# Patient Record
Sex: Female | Born: 1970 | Race: Black or African American | Hispanic: No | Marital: Married | Smoking: Never smoker
Health system: Southern US, Community
[De-identification: ages and names within clinical notes are randomized; demographics above are authoritative.]

## PROBLEM LIST (undated history)

## (undated) DIAGNOSIS — B999 Unspecified infectious disease: Secondary | ICD-10-CM

## (undated) DIAGNOSIS — D649 Anemia, unspecified: Secondary | ICD-10-CM

## (undated) DIAGNOSIS — E559 Vitamin D deficiency, unspecified: Secondary | ICD-10-CM

## (undated) HISTORY — PX: DILATION AND CURETTAGE OF UTERUS: SHX78

---

## 2006-03-17 ENCOUNTER — Emergency Department (HOSPITAL_COMMUNITY): Admission: EM | Admit: 2006-03-17 | Discharge: 2006-03-17 | Payer: Self-pay | Admitting: Emergency Medicine

## 2006-06-02 ENCOUNTER — Emergency Department (HOSPITAL_COMMUNITY): Admission: EM | Admit: 2006-06-02 | Discharge: 2006-06-02 | Payer: Self-pay | Admitting: Emergency Medicine

## 2006-07-10 ENCOUNTER — Emergency Department (HOSPITAL_COMMUNITY): Admission: EM | Admit: 2006-07-10 | Discharge: 2006-07-10 | Payer: Self-pay | Admitting: Emergency Medicine

## 2016-01-19 ENCOUNTER — Encounter (HOSPITAL_COMMUNITY): Payer: Self-pay | Admitting: Emergency Medicine

## 2016-01-19 ENCOUNTER — Emergency Department (HOSPITAL_COMMUNITY)

## 2016-01-19 ENCOUNTER — Emergency Department (HOSPITAL_COMMUNITY)
Admission: EM | Admit: 2016-01-19 | Discharge: 2016-01-19 | Disposition: A | Discharge status: Home / Self Care | Attending: Emergency Medicine | Admitting: Emergency Medicine

## 2016-01-19 DIAGNOSIS — Z79899 Other long term (current) drug therapy: Secondary | ICD-10-CM | POA: Diagnosis not present

## 2016-01-19 DIAGNOSIS — O039 Complete or unspecified spontaneous abortion without complication: Secondary | ICD-10-CM

## 2016-01-19 DIAGNOSIS — O4690 Antepartum hemorrhage, unspecified, unspecified trimester: Secondary | ICD-10-CM | POA: Diagnosis present

## 2016-01-19 DIAGNOSIS — N939 Abnormal uterine and vaginal bleeding, unspecified: Secondary | ICD-10-CM

## 2016-01-19 HISTORY — DX: Vitamin D deficiency, unspecified: E55.9

## 2016-01-19 LAB — CBC WITH DIFFERENTIAL/PLATELET
Basophils Absolute: 0.1 10*3/uL (ref 0.0–0.1)
Basophils Relative: 1 %
EOS PCT: 3 %
Eosinophils Absolute: 0.2 10*3/uL (ref 0.0–0.7)
HCT: 34.8 % — ABNORMAL LOW (ref 36.0–46.0)
Hemoglobin: 11.5 g/dL — ABNORMAL LOW (ref 12.0–15.0)
LYMPHS ABS: 1.6 10*3/uL (ref 0.7–4.0)
LYMPHS PCT: 34 %
MCH: 30.7 pg (ref 26.0–34.0)
MCHC: 33 g/dL (ref 30.0–36.0)
MCV: 93 fL (ref 78.0–100.0)
MONOS PCT: 8 %
Monocytes Absolute: 0.4 10*3/uL (ref 0.1–1.0)
Neutro Abs: 2.5 10*3/uL (ref 1.7–7.7)
Neutrophils Relative %: 54 %
PLATELETS: 235 10*3/uL (ref 150–400)
RBC: 3.74 MIL/uL — AB (ref 3.87–5.11)
RDW: 12.9 % (ref 11.5–15.5)
WBC: 4.6 10*3/uL (ref 4.0–10.5)

## 2016-01-19 LAB — WET PREP, GENITAL
CLUE CELLS WET PREP: NONE SEEN
SPERM: NONE SEEN
TRICH WET PREP: NONE SEEN
Yeast Wet Prep HPF POC: NONE SEEN

## 2016-01-19 LAB — ABO/RH: ABO/RH(D): A POS

## 2016-01-19 LAB — I-STAT BETA HCG BLOOD, ED (MC, WL, AP ONLY): I-stat hCG, quantitative: 8 m[IU]/mL — ABNORMAL HIGH (ref <5)

## 2016-01-19 NOTE — Discharge Instructions (Signed)
Miscarriage  A miscarriage is the sudden loss of an unborn baby (fetus) before the 20th week of pregnancy. Most miscarriages happen in the first 3 months of pregnancy. Sometimes, it happens before a woman even knows she is pregnant. A miscarriage is also called a "spontaneous miscarriage" or "early pregnancy loss." Having a miscarriage can be an emotional experience. Talk with your caregiver about any questions you may have about miscarrying, the grieving process, and your future pregnancy plans.  CAUSES    Problems with the fetal chromosomes that make it impossible for the baby to develop normally. Problems with the baby's genes or chromosomes are most often the result of errors that occur, by chance, as the embryo divides and grows. The problems are not inherited from the parents.   Infection of the cervix or uterus.    Hormone problems.    Problems with the cervix, such as having an incompetent cervix. This is when the tissue in the cervix is not strong enough to hold the pregnancy.    Problems with the uterus, such as an abnormally shaped uterus, uterine fibroids, or congenital abnormalities.    Certain medical conditions.    Smoking, drinking alcohol, or taking illegal drugs.    Trauma.   Often, the cause of a miscarriage is unknown.   SYMPTOMS    Vaginal bleeding or spotting, with or without cramps or pain.   Pain or cramping in the abdomen or lower back.   Passing fluid, tissue, or blood clots from the vagina.  DIAGNOSIS   Your caregiver will perform a physical exam. You may also have an ultrasound to confirm the miscarriage. Blood or urine tests may also be ordered.  TREATMENT    Sometimes, treatment is not necessary if you naturally pass all the fetal tissue that was in the uterus. If some of the fetus or placenta remains in the body (incomplete miscarriage), tissue left behind may become infected and must be removed. Usually, a dilation and curettage (D and C) procedure is performed.  During a D and C procedure, the cervix is widened (dilated) and any remaining fetal or placental tissue is gently removed from the uterus.   Antibiotic medicines are prescribed if there is an infection. Other medicines may be given to reduce the size of the uterus (contract) if there is a lot of bleeding.   If you have Rh negative blood and your baby was Rh positive, you will need a Rh immunoglobulin shot. This shot will protect any future baby from having Rh blood problems in future pregnancies.  HOME CARE INSTRUCTIONS    Your caregiver may order bed rest or may allow you to continue light activity. Resume activity as directed by your caregiver.   Have someone help with home and family responsibilities during this time.    Keep track of the number of sanitary pads you use each day and how soaked (saturated) they are. Write down this information.    Do not use tampons. Do not douche or have sexual intercourse until approved by your caregiver.    Only take over-the-counter or prescription medicines for pain or discomfort as directed by your caregiver.    Do not take aspirin. Aspirin can cause bleeding.    Keep all follow-up appointments with your caregiver.    If you or your partner have problems with grieving, talk to your caregiver or seek counseling to help cope with the pregnancy loss. Allow enough time to grieve before trying to get pregnant again.     SEEK IMMEDIATE MEDICAL CARE IF:    You have severe cramps or pain in your back or abdomen.   You have a fever.   You pass large blood clots (walnut-sized or larger) ortissue from your vagina. Save any tissue for your caregiver to inspect.    Your bleeding increases.    You have a thick, bad-smelling vaginal discharge.   You become lightheaded, weak, or you faint.    You have chills.   MAKE SURE YOU:   Understand these instructions.   Will watch your condition.   Will get help right away if you are not doing well or get worse.     This  information is not intended to replace advice given to you by your health care provider. Make sure you discuss any questions you have with your health care provider.     Document Released: 01/19/2001 Document Revised: 11/20/2012 Document Reviewed: 09/14/2011  Elsevier Interactive Patient Education 2016 [employer]

## 2016-01-19 NOTE — ED Notes (Signed)
Pt c/o vag bleeding -- spotting started yesterday with light pink color, got heavier as day went on-- using overnight pads-- changed x 3 todfay-- states "not saturated" positive pregnancy test x 2 on Monday.

## 2016-01-19 NOTE — ED Provider Notes (Signed)
CSN: [id number]     Arrival date & time 01/19/16  1036 History  By signing my name below, I, Kelli Luna, attest that this documentation has been prepared under the direction and in the presence of Kelli Reichert, MD. Electronically Signed: Eustaquio Luna, ED Scribe. 01/19/2016. 5:42 PM.   Chief Complaint  Patient presents with  . Vaginal Bleeding  . Routine Prenatal Visit   The history is provided by the patient. No language interpreter was used.    HPI Comments: Kelli Luna is a 45 y.o. female who presents to the Emergency Department complaining of gradual onset, constant, vaginal bleeding that began yesterday. Pt reports that it started out as light spotting and has progressed to heavy bleeding. She also complains of intermittent mild abdominal cramping. Pt took 2 at home pregnancy tests 1 week ago which were positive. She noticed that she passed tissue like substance while using the restroom earlier today. [id number]. Pt states that the first time she had a miscarriage (1994) she was told that her body was still getting used to being pregnant. The second time she was pregnant (again in 1994) she carried the child to 6 months. The physicians could not find anything wrong with pt at that time but did not due an autopsy of the child. Pt had a D&C for the first pregnancy and had to induce labor. No hx bleeding disorders. LNMP: 12/15/2015. Denies any other associated symptoms.   Past Medical History  Diagnosis Date  . Vitamin D deficiency    Past Surgical History  Procedure Laterality Date  . Dilation and curettage of uterus     No family history on file. Social History  Substance Use Topics  . Smoking status: Never Smoker   . Smokeless tobacco: None  . Alcohol Use: No   OB History    Gravida Para Term Preterm AB TAB SAB Ectopic Multiple Living   3 0 0  2  2        Review of Systems  Gastrointestinal: Positive for abdominal pain.  Genitourinary: Positive for vaginal bleeding.  All  other systems reviewed and are negative.  Allergies  Review of patient's allergies indicates not on file.  Home Medications   Prior to Admission medications   Medication Sig Start Date End Date Taking? Authorizing Provider  fexofenadine (ALLEGRA) 180 MG tablet Take 180 mg by mouth daily as needed for allergies or rhinitis.   Yes Historical Provider, MD  Ibuprofen-Diphenhydramine Cit (ADVIL PM) 200-38 MG TABS Take 1-2 tablets by mouth at bedtime as needed (FOR PAIN AND SLEEP).   Yes Historical Provider, MD  Prenatal Vit-Fe Fumarate-FA (MULTIVITAMIN-PRENATAL) 27-0.8 MG TABS tablet Take 1 tablet by mouth daily at 12 noon.   Yes Historical Provider, MD   BP 119/90 mmHg  Pulse 69  Temp(Src) 98.1 F (36.7 C) (Oral)  Resp 16  SpO2 100%   Physical Exam  Constitutional: She is oriented to person, place, and time. She appears well-developed and well-nourished.  HENT:  Head: Normocephalic and atraumatic.  Cardiovascular: Normal rate and regular rhythm.   No murmur heard. Pulmonary/Chest: Effort normal and breath sounds normal. No respiratory distress.  Abdominal: Soft. There is no tenderness. There is no rebound and no guarding.  Genitourinary:  Female chaperon present.  Os was finger tip open. Moderate vaginal bleeding with clots evacuated from vaginal vault with no recurrent pooling. No CMT.  Musculoskeletal: She exhibits no edema or tenderness.  Neurological: She is alert and oriented to person, place, and time.  Skin: Skin is warm and dry.  Psychiatric: She has a normal mood and affect. Her behavior is normal.  Nursing note and vitals reviewed.   ED Course  Procedures (including critical care time)  DIAGNOSTIC STUDIES: Oxygen Saturation is 100% on RA, normal by my interpretation.    COORDINATION OF CARE: 5:42 PM-Discussed treatment plan which includes US Pelvis and pelvic exam with pt at bedside and pt agreed to plan.   Labs Review Labs Reviewed  WET PREP, GENITAL -  Abnormal; Notable for the following:    WBC, Wet Prep HPF POC PRESENT (*)    All other components within normal limits  CBC WITH DIFFERENTIAL/PLATELET - Abnormal; Notable for the following:    RBC 3.74 (*)    Hemoglobin 11.5 (*)    HCT 34.8 (*)    All other components within normal limits  I-STAT BETA HCG BLOOD, ED ([facility], [facility], [facility] ONLY) - Abnormal; Notable for the following:    I-stat hCG, quantitative 8.0 (*)    All other components within normal limits  ABO/RH  GC/CHLAMYDIA PROBE AMP ([place]) NOT AT [facility]    Imaging Review US Ob Comp Less 14 Wks  01/19/2016  CLINICAL DATA:  Acute onset of vaginal bleeding.  Initial encounter. EXAM: OBSTETRIC <14 WK [place] AND TRANSVAGINAL OB US TECHNIQUE: Both transabdominal and transvaginal ultrasound examinations were performed for complete evaluation of the gestation as well as the maternal uterus, adnexal regions, and pelvic cul-de-sac. Transvaginal technique was performed to assess early pregnancy. COMPARISON:  CT of the abdomen and pelvis from 07/10/2006 FINDINGS: Intrauterine gestational sac: None seen. Yolk sac:  N/A Embryo:  N/A Subchorionic hemorrhage:  None visualized. Maternal uterus/adnexae: A 4.0 cm fibroid is noted at the left side of the uterus, and a 3.0 cm fibroid is noted at the anterior superior aspect of the uterus. This partially obscures the endometrial echo complex near the uterine fundus. The ovaries are unremarkable in appearance. The right ovary measures 4.0 x 3.4 x 2.1 cm, while the left ovary measures 2.6 x 2.1 x 1.4 cm. No suspicious adnexal masses are seen; there is no evidence for ovarian torsion. No free fluid is seen within the pelvic cul-de-sac. IMPRESSION: 1. Uterine fibroids measure up to 4.0 cm in size. This partially obscures the endometrial echo complex near the uterine fundus. Uterus otherwise unremarkable. 2. Ovaries unremarkable in appearance. No evidence for ovarian torsion. Electronically Signed   By: Garald Balding M.D.    On: 01/19/2016 20:23   US Ob Transvaginal  01/19/2016  CLINICAL DATA:  Acute onset of vaginal bleeding.  Initial encounter. EXAM: OBSTETRIC <14 WK [place] AND TRANSVAGINAL OB US TECHNIQUE: Both transabdominal and transvaginal ultrasound examinations were performed for complete evaluation of the gestation as well as the maternal uterus, adnexal regions, and pelvic cul-de-sac. Transvaginal technique was performed to assess early pregnancy. COMPARISON:  CT of the abdomen and pelvis from 07/10/2006 FINDINGS: Intrauterine gestational sac: None seen. Yolk sac:  N/A Embryo:  N/A Subchorionic hemorrhage:  None visualized. Maternal uterus/adnexae: A 4.0 cm fibroid is noted at the left side of the uterus, and a 3.0 cm fibroid is noted at the anterior superior aspect of the uterus. This partially obscures the endometrial echo complex near the uterine fundus. The ovaries are unremarkable in appearance. The right ovary measures 4.0 x 3.4 x 2.1 cm, while the left ovary measures 2.6 x 2.1 x 1.4 cm. No suspicious adnexal masses are seen; there is no evidence for ovarian torsion. No free  fluid is seen within the pelvic cul-de-sac. IMPRESSION: 1. Uterine fibroids measure up to 4.0 cm in size. This partially obscures the endometrial echo complex near the uterine fundus. Uterus otherwise unremarkable. 2. Ovaries unremarkable in appearance. No evidence for ovarian torsion. Electronically Signed   By: Garald Balding M.D.   On: 01/19/2016 20:23   I have personally reviewed and evaluated these images and lab results as part of my medical decision-making.   EKG Interpretation None      MDM   Final diagnoses:  Vaginal bleeding  Miscarriage   Patient here for evaluation of vaginal bleeding. She had 2 positive pregnancy test earlier in this week at home. Beta hCG is minimally elevated at less than 8. Examination demonstrates small moderate amount of vaginal bleeding with clots present that were evacuated. There were no clots in  her Liane Comber her os is fingertip open. No evidence of major hemorrhage. Ultrasound with fibroids but otherwise unremarkable. Discussed with patient likely miscarriage. Discussed home care and outpatient follow-up as well as return precautions for bleeding. Discussed the case with on-call physician with [facility], plan to send for repeat quantitative hCG in one week  I personally performed the services described in this documentation, which was scribed in my presence. The recorded information has been reviewed and is accurate.      Kelli Reichert, MD 01/20/16 0104

## 2016-01-19 NOTE — ED Notes (Signed)
Assisted Ralene Bathe, MD with pelvic examination and collection of vaginal samples; visitor stayed at bedside per patient

## 2016-01-20 LAB — GC/CHLAMYDIA PROBE AMP (~~LOC~~) NOT AT ARMC
Chlamydia: NEGATIVE
NEISSERIA GONORRHEA: NEGATIVE

## 2016-01-27 ENCOUNTER — Encounter (HOSPITAL_COMMUNITY): Payer: Self-pay | Admitting: *Deleted

## 2016-01-27 ENCOUNTER — Inpatient Hospital Stay (HOSPITAL_COMMUNITY)
Admission: AD | Admit: 2016-01-27 | Discharge: 2016-01-27 | Disposition: A | Discharge status: Home / Self Care | Source: Ambulatory Visit | Attending: Family Medicine | Admitting: Family Medicine

## 2016-01-27 DIAGNOSIS — Z3A01 Less than 8 weeks gestation of pregnancy: Secondary | ICD-10-CM | POA: Diagnosis not present

## 2016-01-27 DIAGNOSIS — Z09 Encounter for follow-up examination after completed treatment for conditions other than malignant neoplasm: Secondary | ICD-10-CM | POA: Diagnosis not present

## 2016-01-27 DIAGNOSIS — O99281 Endocrine, nutritional and metabolic diseases complicating pregnancy, first trimester: Secondary | ICD-10-CM | POA: Insufficient documentation

## 2016-01-27 DIAGNOSIS — O039 Complete or unspecified spontaneous abortion without complication: Secondary | ICD-10-CM

## 2016-01-27 DIAGNOSIS — E559 Vitamin D deficiency, unspecified: Secondary | ICD-10-CM | POA: Diagnosis not present

## 2016-01-27 HISTORY — DX: Unspecified infectious disease: B99.9

## 2016-01-27 HISTORY — DX: Anemia, unspecified: D64.9

## 2016-01-27 LAB — HCG, QUANTITATIVE, PREGNANCY: HCG, BETA CHAIN, QUANT, S: 1 m[IU]/mL (ref <5)

## 2016-01-27 MED ORDER — PRENATAL 28-0.8 MG PO TABS: 1.0000 | ORAL_TABLET | Freq: Every day | ORAL | Status: DC

## 2016-01-27 NOTE — MAU Note (Addendum)
Feel really good today.  No spotting or anything since Friday, Friday morning had bad cramps  And some bleeding, passed a clot.  HX: had 2 + preg tests, had made an appointment to see primary (6/12) started bleeding 6/11, got heavier and started passing small clots, was having some cramping. Went to primary, was sent on to ED.  Went to [facility], was told to follow up here in a weekend.

## 2016-01-27 NOTE — MAU Provider Note (Signed)
History   [id number]   Chief Complaint  Patient presents with  . Follow-up    HPI Kelli Luna is a 45 y.o. female G3P0110 here for follow-up BHCG.  Upon review of the records patient was first seen on 6/12 at [facility] for vaginal bleeding.   BHCG on that day was 8. Pt reported 2 +HPTs the week prior. Ultrasound showed no IUP or adnexal mass. Pt discharged home to f/u at [facility] in 1 week for BHCG. Pt here today with no report of abdominal pain or vaginal bleeding.   All other systems negative.     Patient's last menstrual period was 12/15/2015.  OB History  Gravida Para Term Preterm AB SAB TAB Ectopic Multiple Living  3 1 0 1 1 1         # Outcome Date GA Lbr Len/2nd Weight Sex Delivery Anes PTL Lv  3 Current           2 SAB 1994          1 Preterm     M    FD    Obstetric Comments  Was 5-6 months    Past Medical History  Diagnosis Date  . Vitamin D deficiency   . Anemia   . Infection     UTI    Family History  Problem Relation Age of Onset  . Lupus Mother     Social History   Social History  . Marital Status: Married    Spouse Name: N/A  . Number of Children: N/A  . Years of Education: N/A   Social History Main Topics  . Smoking status: Never Smoker   . Smokeless tobacco: Never Used  . Alcohol Use: No  . Drug Use: No  . Sexual Activity: Not Asked   Other Topics Concern  . None   Social History Narrative    Not on File  No current facility-administered medications on file prior to encounter.   Current Outpatient Prescriptions on File Prior to Encounter  Medication Sig Dispense Refill  . fexofenadine (ALLEGRA) 180 MG tablet Take 180 mg by mouth daily as needed for allergies or rhinitis.    . Ibuprofen-Diphenhydramine Cit (ADVIL PM) 200-38 MG TABS Take 1-2 tablets by mouth at bedtime as needed (FOR PAIN AND SLEEP).    . Prenatal Vit-Fe Fumarate-FA (MULTIVITAMIN-PRENATAL) 27-0.8 MG TABS tablet Take 1 tablet by mouth daily at 12 noon.       Physical  Exam   Filed Vitals:   01/27/16 1235 01/27/16 1244  BP:  113/74  Pulse:  81  Temp:  98.4 F (36.9 C)  TempSrc:  Oral  Resp:  16  Weight: 198 lb 1.3 oz (89.848 kg)     Physical Exam  Constitutional: She appears well-developed and well-nourished. No distress.  HENT:  Head: Normocephalic and atraumatic.  Cardiovascular: Normal rate.   Respiratory: Effort normal. No respiratory distress.  Musculoskeletal: Normal range of motion.  Skin: She is not diaphoretic.  Psychiatric: She has a normal mood and affect. Her behavior is normal. Judgment and thought content normal.    MAU Course  Procedures Results for orders placed or performed during the hospital encounter of 01/27/16 (from the past 24 hour(s))  hCG, quantitative, pregnancy     Status: None   Collection Time: 01/27/16 12:35 PM  Result Value Ref Range   hCG, Beta Chain, Quant, S 1 <5 mIU/mL    MDM BHCG negative today Denies abdominal pain or vaginal bleeding.   Assessment  and Plan  45 y.o. G3P0110 at [redacted]w[redacted]d wks Pregnancy Follow-up BHCG Chemical pregnancy  P: Discharge home Discussed reasons to return to MAU Pt interested in going to [facility] for ob/gyn care -- information provided    Jorje Guild, NP 01/27/2016 2:03 PM

## 2016-01-27 NOTE — Discharge Instructions (Signed)
Miscarriage  A miscarriage is the sudden loss of an unborn baby (fetus) before the 20th week of pregnancy. Most miscarriages happen in the first 3 months of pregnancy. Sometimes, it happens before a woman even knows she is pregnant. A miscarriage is also called a "spontaneous miscarriage" or "early pregnancy loss." Having a miscarriage can be an emotional experience. Talk with your caregiver about any questions you may have about miscarrying, the grieving process, and your future pregnancy plans.  CAUSES    Problems with the fetal chromosomes that make it impossible for the baby to develop normally. Problems with the baby's genes or chromosomes are most often the result of errors that occur, by chance, as the embryo divides and grows. The problems are not inherited from the parents.   Infection of the cervix or uterus.    Hormone problems.    Problems with the cervix, such as having an incompetent cervix. This is when the tissue in the cervix is not strong enough to hold the pregnancy.    Problems with the uterus, such as an abnormally shaped uterus, uterine fibroids, or congenital abnormalities.    Certain medical conditions.    Smoking, drinking alcohol, or taking illegal drugs.    Trauma.   Often, the cause of a miscarriage is unknown.   SYMPTOMS    Vaginal bleeding or spotting, with or without cramps or pain.   Pain or cramping in the abdomen or lower back.   Passing fluid, tissue, or blood clots from the vagina.  DIAGNOSIS   Your caregiver will perform a physical exam. You may also have an ultrasound to confirm the miscarriage. Blood or urine tests may also be ordered.  TREATMENT    Sometimes, treatment is not necessary if you naturally pass all the fetal tissue that was in the uterus. If some of the fetus or placenta remains in the body (incomplete miscarriage), tissue left behind may become infected and must be removed. Usually, a dilation and curettage (D and C) procedure is performed.  During a D and C procedure, the cervix is widened (dilated) and any remaining fetal or placental tissue is gently removed from the uterus.   Antibiotic medicines are prescribed if there is an infection. Other medicines may be given to reduce the size of the uterus (contract) if there is a lot of bleeding.   If you have Rh negative blood and your baby was Rh positive, you will need a Rh immunoglobulin shot. This shot will protect any future baby from having Rh blood problems in future pregnancies.  HOME CARE INSTRUCTIONS    Your caregiver may order bed rest or may allow you to continue light activity. Resume activity as directed by your caregiver.   Have someone help with home and family responsibilities during this time.    Keep track of the number of sanitary pads you use each day and how soaked (saturated) they are. Write down this information.    Do not use tampons. Do not douche or have sexual intercourse until approved by your caregiver.    Only take over-the-counter or prescription medicines for pain or discomfort as directed by your caregiver.    Do not take aspirin. Aspirin can cause bleeding.    Keep all follow-up appointments with your caregiver.    If you or your partner have problems with grieving, talk to your caregiver or seek counseling to help cope with the pregnancy loss. Allow enough time to grieve before trying to get pregnant again.     SEEK IMMEDIATE MEDICAL CARE IF:    You have severe cramps or pain in your back or abdomen.   You have a fever.   You pass large blood clots (walnut-sized or larger) ortissue from your vagina. Save any tissue for your caregiver to inspect.    Your bleeding increases.    You have a thick, bad-smelling vaginal discharge.   You become lightheaded, weak, or you faint.    You have chills.   MAKE SURE YOU:   Understand these instructions.   Will watch your condition.   Will get help right away if you are not doing well or get worse.     This  information is not intended to replace advice given to you by your health care provider. Make sure you discuss any questions you have with your health care provider.     Document Released: 01/19/2001 Document Revised: 11/20/2012 Document Reviewed: 09/14/2011  Elsevier Interactive Patient Education 2016 [employer]

## 2016-02-23 ENCOUNTER — Telehealth: Payer: Self-pay

## 2016-02-23 NOTE — Telephone Encounter (Signed)
Called pt per request of Dr. Ihor Dow to find out if pt needs referral to our office for prenatal care.  LM for pt to return call. Re:  Referral request located in provider folder.

## 2016-02-25 ENCOUNTER — Other Ambulatory Visit

## 2016-02-26 NOTE — Telephone Encounter (Signed)
Per Chart patient has appt schedule for BHCG on 03/01/2016

## 2016-03-01 ENCOUNTER — Other Ambulatory Visit

## 2016-03-01 DIAGNOSIS — O039 Complete or unspecified spontaneous abortion without complication: Secondary | ICD-10-CM

## 2016-03-01 LAB — HCG, QUANTITATIVE, PREGNANCY: hCG, Beta Chain, Quant, S: <2 m[IU]/mL

## 2016-03-15 NOTE — Telephone Encounter (Signed)
03/01/16 beta level resulted < 2.  Pt does not need appt for prenatal care.

## 2016-07-19 IMAGING — US US OB COMP LESS 14 WK
1 series · 13 of 28 positions shown · non-contrast
Comparison: CT of the abdomen and pelvis from 07/10/2006

CLINICAL DATA: Acute onset of vaginal bleeding.  Initial encounter.

EXAM:
OBSTETRIC <14 WK US AND TRANSVAGINAL OB US
TECHNIQUE: Both transabdominal and transvaginal ultrasound examinations were
performed for complete evaluation of the gestation as well as the
maternal uterus, adnexal regions, and pelvic cul-de-sac.
Transvaginal technique was performed to assess early pregnancy.

[Series 1: us ob comp less 14 wk · 0.26mm/px · 65 acquisitions, 13 frames shown]
[im 3/65]
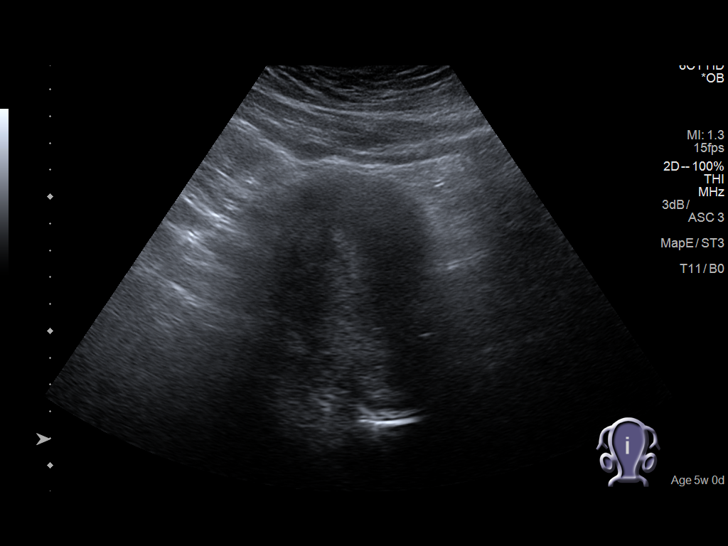
[im 8/65]
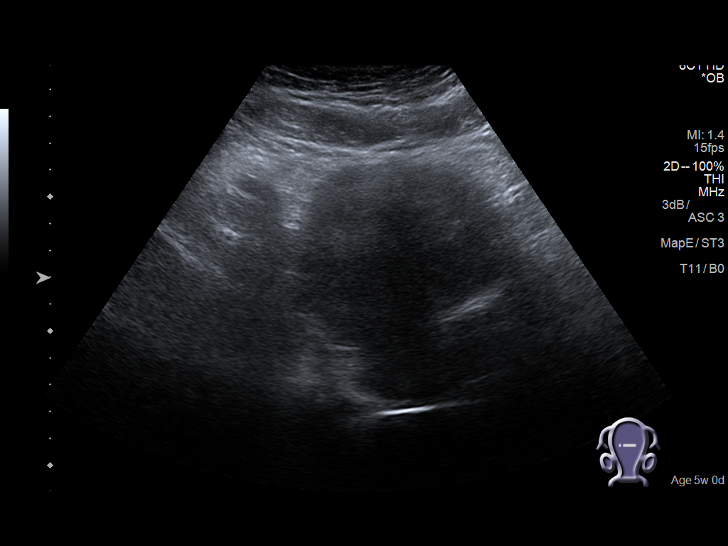
[im 12/65]
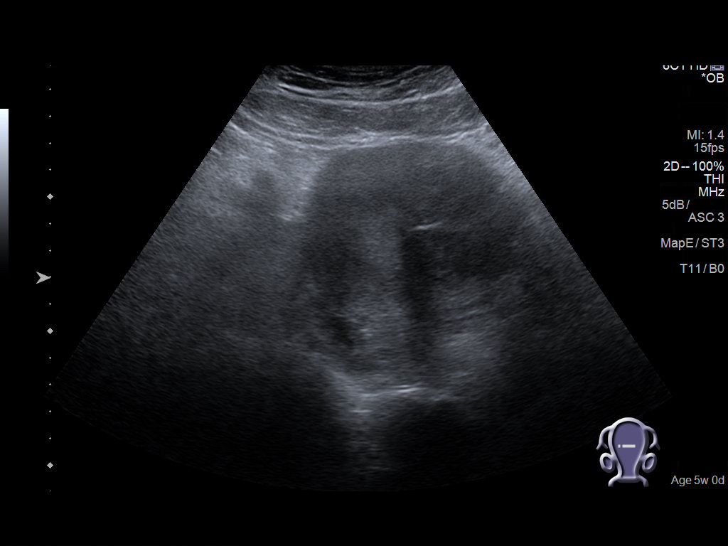
[im 17/65]
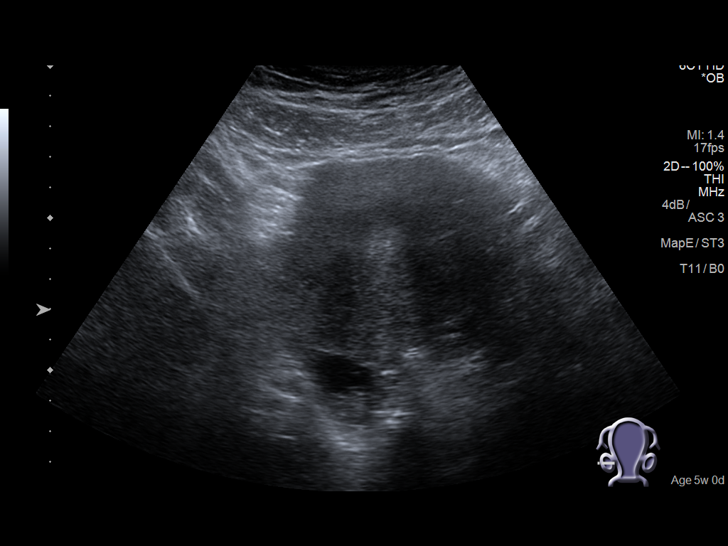
[im 22/65]
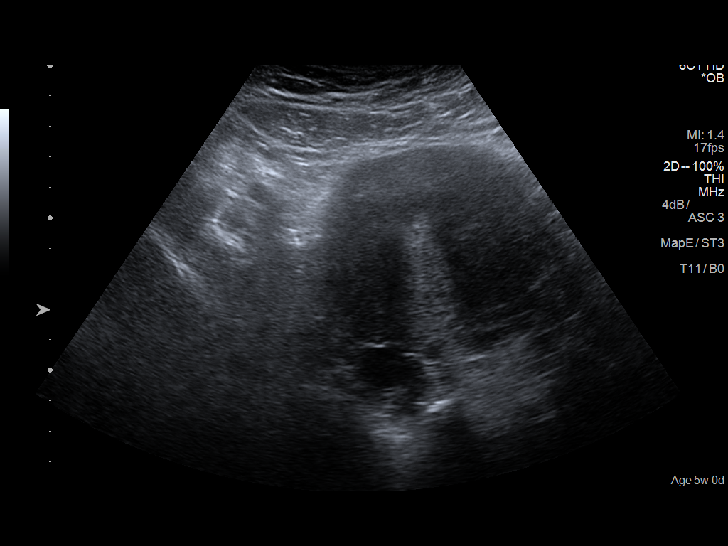
[im 27/65]
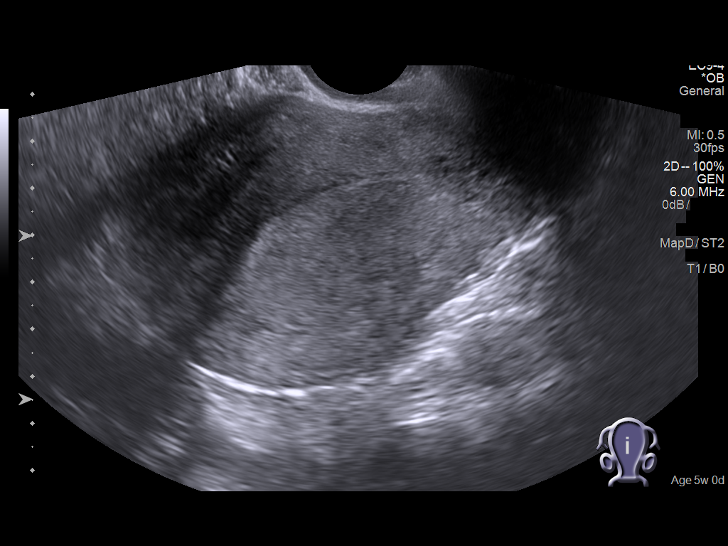
[im 34/65]
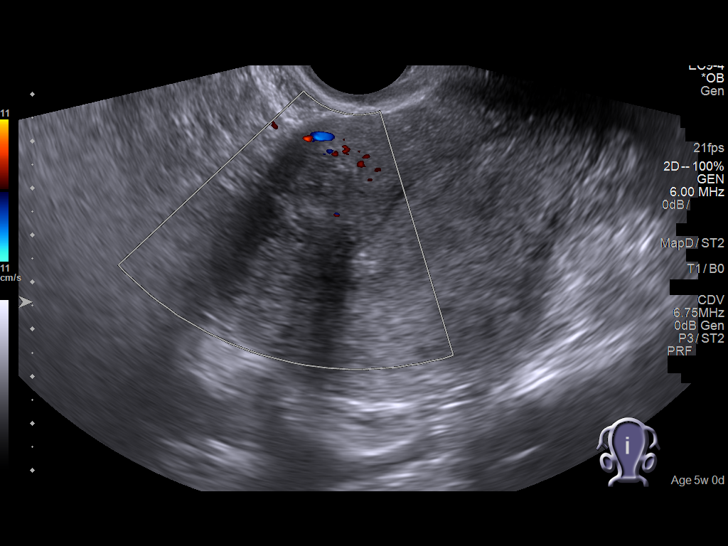
[im 38/65]
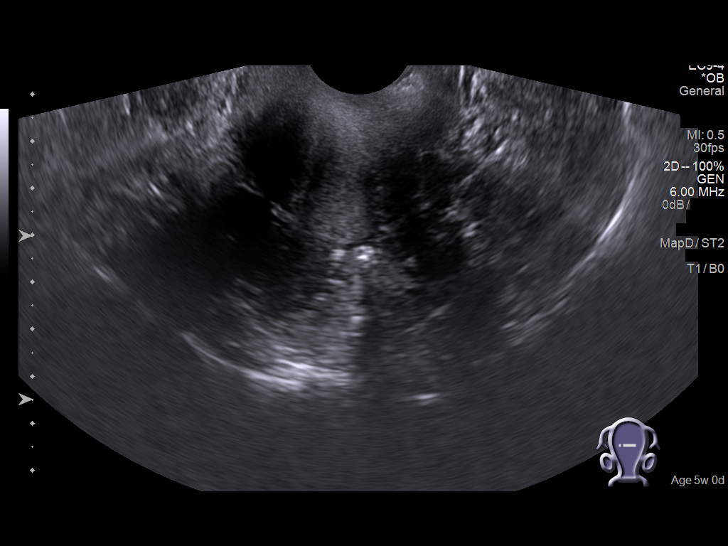
[im 43/65]
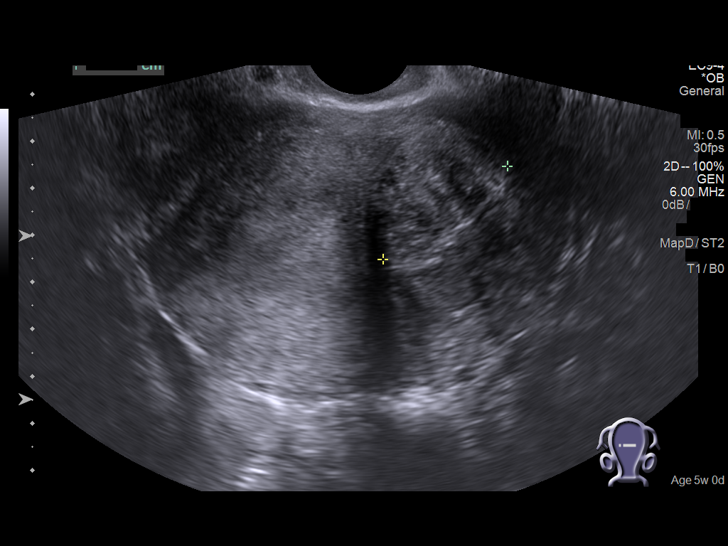
[im 48/65]
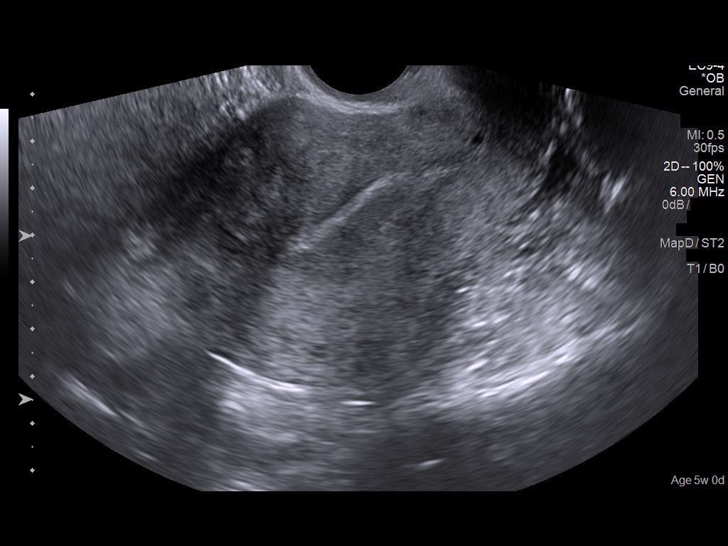
[im 53/65]
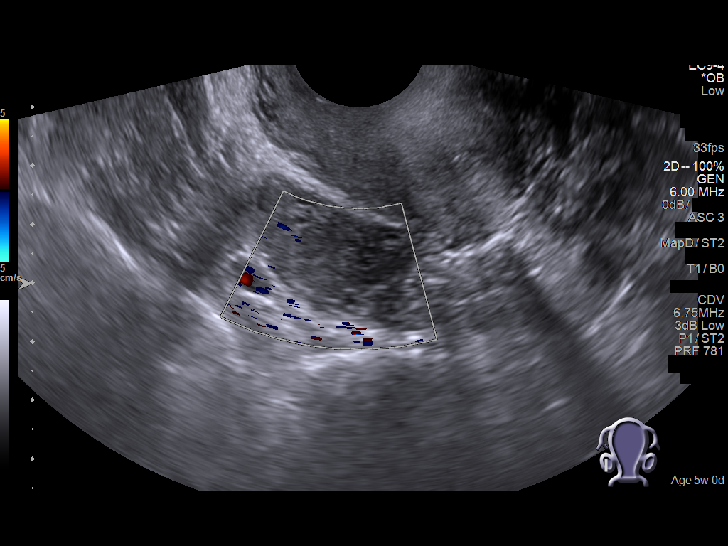
[im 57/65]
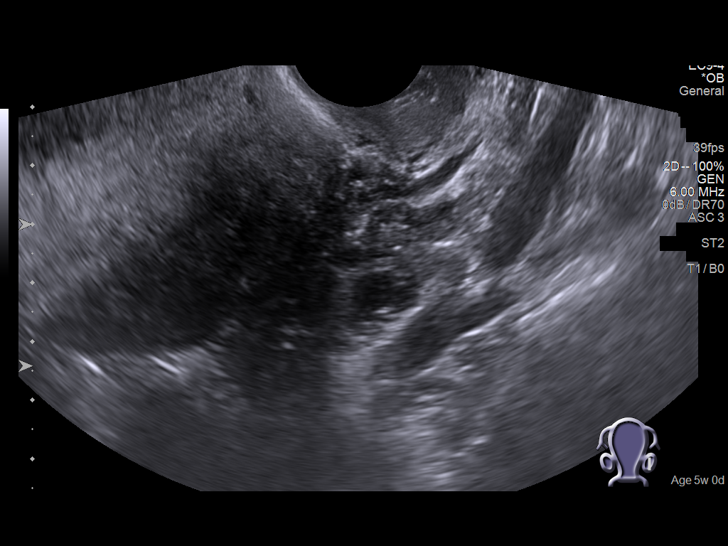
[im 62/65]
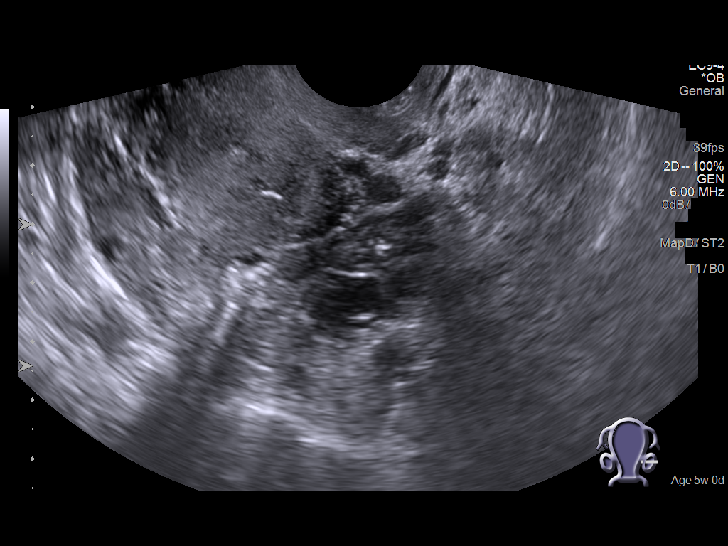

[13 of 28 positions shown; findings below may reference images not displayed]

FINDINGS: Intrauterine gestational sac: None seen.

Yolk sac:  N/A

Embryo:  N/A

Subchorionic hemorrhage:  None visualized.

Maternal uterus/adnexae: A 4.0 cm fibroid is noted at the left side
of the uterus, and a 3.0 cm fibroid is noted at the anterior
superior aspect of the uterus. This partially obscures the
endometrial echo complex near the uterine fundus.

The ovaries are unremarkable in appearance. The right ovary measures
4.0 x 3.4 x 2.1 cm, while the left ovary measures 2.6 x 2.1 x
cm. No suspicious adnexal masses are seen; there is no evidence for
ovarian torsion.

No free fluid is seen within the pelvic cul-de-sac.
IMPRESSION: 1. Uterine fibroids measure up to 4.0 cm in size. This partially
obscures the endometrial echo complex near the uterine fundus.
Uterus otherwise unremarkable.
2. Ovaries unremarkable in appearance. No evidence for ovarian
torsion.

## 2016-09-15 ENCOUNTER — Encounter: Admitting: Obstetrics & Gynecology

## 2016-11-09 ENCOUNTER — Encounter: Payer: Self-pay | Admitting: Obstetrics & Gynecology

## 2016-11-09 ENCOUNTER — Ambulatory Visit (INDEPENDENT_AMBULATORY_CARE_PROVIDER_SITE_OTHER): Admitting: Obstetrics & Gynecology

## 2016-11-09 VITALS — BP 95/65 | HR 72 | Ht 66.0 in | Wt 200.9 lb

## 2016-11-09 DIAGNOSIS — Z01419 Encounter for gynecological examination (general) (routine) without abnormal findings: Secondary | ICD-10-CM | POA: Diagnosis not present

## 2016-11-09 DIAGNOSIS — Z113 Encounter for screening for infections with a predominantly sexual mode of transmission: Secondary | ICD-10-CM

## 2016-11-09 DIAGNOSIS — N852 Hypertrophy of uterus: Secondary | ICD-10-CM

## 2016-11-09 DIAGNOSIS — Z1231 Encounter for screening mammogram for malignant neoplasm of breast: Secondary | ICD-10-CM

## 2016-11-09 LAB — POCT PREGNANCY, URINE: Preg Test, Ur: NEGATIVE

## 2016-11-09 NOTE — Progress Notes (Signed)
Subjective:     Kelli Luna is a 45 y.o. female here for a routine exam.  Current complaints: requests STI screen.  Pt on no contraception.  She did not feel that she could get pregnant due to her age.  She wants an STI screen.  She denies contact.    Gynecologic History Patient's last menstrual period was 12/15/2015. Contraception: none Last Pap: 2016. Results were: pt s/p abnormal PAP s/p colpo but did not get f/u PAP Last mammogram: 2 years. Results were: normal  Obstetric History OB History  Gravida Para Term Preterm AB Living  3 1 0 1 1    SAB TAB Ectopic Multiple Live Births  1            # Outcome Date GA Lbr Len/2nd Weight Sex Delivery Anes PTL Lv  3 Gravida           2 SAB 1994          1 Preterm     M    FD    Obstetric Comments  Was 5-6 months   The following portions of the patient's history were reviewed and updated as appropriate: allergies, current medications, past family history, past medical history, past social history, past surgical history and problem list.  Review of Systems Pertinent items are noted in HPI.    Objective:  BP 95/65   Pulse 72   Ht 5\' 6"  (1.676 m)   Wt 200 lb 14.4 oz (91.1 kg)   LMP 12/15/2015   Breastfeeding? Unknown   BMI 32.43 kg/m  General Appearance:    Alert, cooperative, no distress, appears stated age  Head:    Normocephalic, without obvious abnormality, atraumatic  Eyes:    conjunctiva/corneas clear, EOM's intact, both eyes  Ears:    Normal external ear canals, both ears  Nose:   Nares normal, septum midline, mucosa normal, no drainage    or sinus tenderness  Throat:   Lips, mucosa, and tongue normal; teeth and gums normal  Neck:   Supple, symmetrical, trachea midline, no adenopathy;    thyroid:  no enlargement/tenderness/nodules  Back:     Symmetric, no curvature, ROM normal, no CVA tenderness  Lungs:     Clear to auscultation bilaterally, respirations unlabored  Chest Wall:    No tenderness or deformity   Heart:     Regular rate and rhythm, S1 and S2 normal, no murmur, rub   or gallop  Breast Exam:    No tenderness, masses, or nipple abnormality  Abdomen:     Soft, non-tender, bowel sounds active all four quadrants,    no masses, no organomegaly  Genitalia:    Normal female without lesion, discharge or tenderness     Extremities:   Extremities normal, atraumatic, no cyanosis or edema  Pulses:   2+ and symmetric all extremities  Skin:   Skin color, texture, turgor normal, no rashes or lesions     Assessment:    Healthy female exam.   Enlarged uterus suspect Uterine fibroids STI screen   Plan:    Follow up in: 4 weeks.  for contraception counseling and review of [place]. f/u PAP and cx Pelvic US to eval enlarged uterus Mammogram to be scheduled STI screen- HIV, RPR and Hep C  f/u for contraception counseling  Kelli Luna, M.D., Kelli Luna

## 2016-11-09 NOTE — Patient Instructions (Signed)
Contraception Choices Contraception (birth control) is the use of any methods or devices to prevent pregnancy. Below are some methods to help avoid pregnancy. Hormonal methods  Contraceptive implant. This is a thin, plastic tube containing progesterone hormone. It does not contain estrogen hormone. Your health care provider inserts the tube in the inner part of the upper arm. The tube can remain in place for up to 3 years. After 3 years, the implant must be removed. The implant prevents the ovaries from releasing an egg (ovulation), thickens the cervical mucus to prevent sperm from entering the uterus, and thins the lining of the inside of the uterus.  Progesterone-only injections. These injections are given every 3 months by your health care provider to prevent pregnancy. This synthetic progesterone hormone stops the ovaries from releasing eggs. It also thickens cervical mucus and changes the uterine lining. This makes it harder for sperm to survive in the uterus.  Birth control pills. These pills contain estrogen and progesterone hormone. They work by preventing the ovaries from releasing eggs (ovulation). They also cause the cervical mucus to thicken, preventing the sperm from entering the uterus. Birth control pills are prescribed by a health care provider.Birth control pills can also be used to treat heavy periods.  Minipill. This type of birth control pill contains only the progesterone hormone. They are taken every day of each month and must be prescribed by your health care provider.  Birth control patch. The patch contains hormones similar to those in birth control pills. It must be changed once a week and is prescribed by a health care provider.  Vaginal ring. The ring contains hormones similar to those in birth control pills. It is left in the vagina for 3 weeks, removed for 1 week, and then a new one is put back in place. The patient must be comfortable inserting and removing the ring from  the vagina.A health care provider's prescription is necessary.  Emergency contraception. Emergency contraceptives prevent pregnancy after unprotected sexual intercourse. This pill can be taken right after sex or up to 5 days after unprotected sex. It is most effective the sooner you take the pills after having sexual intercourse. Most emergency contraceptive pills are available without a prescription. Check with your pharmacist. Do not use emergency contraception as your only form of birth control. Barrier methods  Female condom. This is a thin sheath (latex or rubber) that is worn over the penis during sexual intercourse. It can be used with spermicide to increase effectiveness.  Female condom. This is a soft, loose-fitting sheath that is put into the vagina before sexual intercourse.  Diaphragm. This is a soft, latex, dome-shaped barrier that must be fitted by a health care provider. It is inserted into the vagina, along with a spermicidal jelly. It is inserted before intercourse. The diaphragm should be left in the vagina for 6 to 8 hours after intercourse.  Cervical cap. This is a round, soft, latex or plastic cup that fits over the cervix and must be fitted by a health care provider. The cap can be left in place for up to 48 hours after intercourse.  Sponge. This is a soft, circular piece of polyurethane foam. The sponge has spermicide in it. It is inserted into the vagina after wetting it and before sexual intercourse.  Spermicides. These are chemicals that kill or block sperm from entering the cervix and uterus. They come in the form of creams, jellies, suppositories, foam, or tablets. They do not require a prescription. They   are inserted into the vagina with an applicator before having sexual intercourse. The process must be repeated every time you have sexual intercourse. Intrauterine contraception  Intrauterine device (IUD). This is a T-shaped device that is put in a woman's uterus during  a menstrual period to prevent pregnancy. There are 2 types: ? Copper IUD. This type of IUD is wrapped in copper wire and is placed inside the uterus. Copper makes the uterus and fallopian tubes produce a fluid that kills sperm. It can stay in place for 10 years. ? Hormone IUD. This type of IUD contains the hormone progestin (synthetic progesterone). The hormone thickens the cervical mucus and prevents sperm from entering the uterus, and it also thins the uterine lining to prevent implantation of a fertilized egg. The hormone can weaken or kill the sperm that get into the uterus. It can stay in place for 3-5 years, depending on which type of IUD is used. Permanent methods of contraception  Female tubal ligation. This is when the woman's fallopian tubes are surgically sealed, tied, or blocked to prevent the egg from traveling to the uterus.  Hysteroscopic sterilization. This involves placing a small coil or insert into each fallopian tube. Your doctor uses a technique called hysteroscopy to do the procedure. The device causes scar tissue to form. This results in permanent blockage of the fallopian tubes, so the sperm cannot fertilize the egg. It takes about 3 months after the procedure for the tubes to become blocked. You must use another form of birth control for these 3 months.  Female sterilization. This is when the female has the tubes that carry sperm tied off (vasectomy).This blocks sperm from entering the vagina during sexual intercourse. After the procedure, the man can still ejaculate fluid (semen). Natural planning methods  Natural family planning. This is not having sexual intercourse or using a barrier method (condom, diaphragm, cervical cap) on days the woman could become pregnant.  Calendar method. This is keeping track of the length of each menstrual cycle and identifying when you are fertile.  Ovulation method. This is avoiding sexual intercourse during ovulation.  Symptothermal method.  This is avoiding sexual intercourse during ovulation, using a thermometer and ovulation symptoms.  Post-ovulation method. This is timing sexual intercourse after you have ovulated. Regardless of which type or method of contraception you choose, it is important that you use condoms to protect against the transmission of sexually transmitted infections (STIs). Talk with your health care provider about which form of contraception is most appropriate for you. This information is not intended to replace advice given to you by your health care provider. Make sure you discuss any questions you have with your health care provider. Document Released: 07/26/2005 Document Revised: 01/01/2016 Document Reviewed: 01/18/2013 Elsevier Interactive Patient Education  2017 [employer].  

## 2016-11-10 ENCOUNTER — Other Ambulatory Visit: Payer: Self-pay | Admitting: Student

## 2016-11-10 LAB — HEPATITIS C ANTIBODY: Hep C Virus Ab: <0.1 s/co ratio (ref 0.0–0.9)

## 2016-11-10 LAB — RPR: RPR: NONREACTIVE

## 2016-11-10 LAB — HIV ANTIBODY (ROUTINE TESTING W REFLEX): HIV SCREEN 4TH GENERATION: NONREACTIVE

## 2016-11-12 LAB — CYTOLOGY - PAP
Chlamydia: NEGATIVE
HPV: NOT DETECTED
Neisseria Gonorrhea: NEGATIVE
Trichomonas: NEGATIVE

## 2016-11-16 ENCOUNTER — Telehealth: Payer: Self-pay | Admitting: *Deleted

## 2016-11-16 NOTE — Telephone Encounter (Signed)
Per message from Dr. Ihor Dow need to call patient with appointment for colposcopy and endometrial biopsy due to atypical glandular cells on her papsmear.    I called [facility] and left a message we are trying to reach you with some information- please call our office.

## 2016-11-18 ENCOUNTER — Telehealth: Payer: Self-pay | Admitting: *Deleted

## 2016-11-18 NOTE — Telephone Encounter (Signed)
Called patient and left message stating we are trying to reach you in regards to results, please call us back. Will send letter

## 2016-11-18 NOTE — Telephone Encounter (Signed)
Patient called wanting pap results. Please return call.

## 2016-11-18 NOTE — Telephone Encounter (Signed)
Called patient and informed her of pap results and recommendation of colposcopy and endometrial biopsy. Also informed patient of negative blood test results. Patient verbalized understanding to all and is aware of all upcoming appts. Patient had no questions

## 2016-11-23 ENCOUNTER — Encounter (HOSPITAL_COMMUNITY): Payer: Self-pay

## 2016-11-23 ENCOUNTER — Ambulatory Visit (HOSPITAL_COMMUNITY)
Admission: RE | Admit: 2016-11-23 | Discharge: 2016-11-23 | Disposition: A | Discharge status: Home / Self Care | Source: Ambulatory Visit | Attending: Obstetrics & Gynecology | Admitting: Obstetrics & Gynecology

## 2016-11-23 DIAGNOSIS — D251 Intramural leiomyoma of uterus: Secondary | ICD-10-CM | POA: Insufficient documentation

## 2016-11-23 DIAGNOSIS — N852 Hypertrophy of uterus: Secondary | ICD-10-CM

## 2016-11-30 ENCOUNTER — Ambulatory Visit

## 2016-12-01 ENCOUNTER — Telehealth: Payer: Self-pay | Admitting: *Deleted

## 2016-12-01 NOTE — Telephone Encounter (Signed)
Pt left message yesterday stating that our office had referred her for a Mammogram @ [place]. Her insurance [employer]) has informed her that the facility is out of network.  She requests to be referred to an In network facility for the Mammogram. Please call back.

## 2016-12-02 NOTE — Telephone Encounter (Signed)
Spoke to [facility], she stated that they do accept [employer]. I scheduled patient for a mammogram on 5/9 @ 0930. Called patient back, no answer. Left voice mail with appointment details and told her to call back with questions.

## 2016-12-02 NOTE — Telephone Encounter (Signed)
Called patient and spoke with her about her insurance coverage for mammogram. Pt has [employer] and the [facility] out of network, they are requesting she go to somewhere in network. The only other choice is [facility]. I told her I would call to find out whether they were in network and if so schedule her mammogram there. Patient also wanted to know about her pelvic u/s results and these were relayed to her. Patient had multiple questions which were deferred to her visit with Dr Ihor Dow.

## 2016-12-13 ENCOUNTER — Other Ambulatory Visit (HOSPITAL_COMMUNITY)
Admission: RE | Admit: 2016-12-13 | Discharge: 2016-12-13 | Disposition: A | Discharge status: Home / Self Care | Source: Ambulatory Visit | Attending: Obstetrics & Gynecology | Admitting: Obstetrics & Gynecology

## 2016-12-13 ENCOUNTER — Ambulatory Visit (INDEPENDENT_AMBULATORY_CARE_PROVIDER_SITE_OTHER): Admitting: Obstetrics & Gynecology

## 2016-12-13 ENCOUNTER — Encounter: Payer: Self-pay | Admitting: Obstetrics & Gynecology

## 2016-12-13 VITALS — BP 113/68 | HR 74 | Ht 66.0 in | Wt 199.0 lb

## 2016-12-13 DIAGNOSIS — D251 Intramural leiomyoma of uterus: Secondary | ICD-10-CM | POA: Insufficient documentation

## 2016-12-13 DIAGNOSIS — R87619 Unspecified abnormal cytological findings in specimens from cervix uteri: Secondary | ICD-10-CM | POA: Insufficient documentation

## 2016-12-13 DIAGNOSIS — J301 Allergic rhinitis due to pollen: Secondary | ICD-10-CM

## 2016-12-13 DIAGNOSIS — N854 Malposition of uterus: Secondary | ICD-10-CM | POA: Diagnosis not present

## 2016-12-13 MED ORDER — FEXOFENADINE HCL 180 MG PO TABS: 180.0000 mg | ORAL_TABLET | Freq: Every day | ORAL | Status: DC

## 2016-12-13 NOTE — Progress Notes (Signed)
The pt is here for Colpo and Endo bx for AGCUS PAP 11/12/2016.   Patient given informed consent, signed copy in the chart, time out was performed.  Placed in lithotomy position. Cervix viewed with speculum and colposcope after application of acetic acid.  PAP: 11/12/2016 Satisfactory for evaluation endocervical/transformation zone component PRESENT.     Diagnosis ATYPICAL GLANDULAR CELLS.    Diagnosis SEE COMMENT.    Diagnosis COMMENT:    Diagnosis   THERE ARE SCATTERED GROUPS OF ATYPICAL GLANDULAR CELLS WHICH APPEAR TO BE OF ENDOCERVICAL ORIGIN. TISSUE STUDIES SUCH AS A CURETTAGE MAY HELP BETTER EVALUATE THE EXTENT AND SEVERITY OF THESE ATYPICAL CELLS.     HPV NOT DETECTED     Colposcopy adequate?  yes [place]etowhite lesions? no Punctation? no Mosaicism?  no Abnormal vasculature?   no Biopsies? no ECC? yes   The indications for endometrial biopsy were reviewed.   Risks of the biopsy including cramping, bleeding, infection, uterine perforation, inadequate specimen and need for additional procedures  were discussed. The patient states she understands and agrees to undergo procedure today. Consent was signed. Time out was performed. Urine HCG was negative. A sterile speculum was placed in the patient's vagina and the cervix was prepped with Betadine. A single-toothed tenaculum was placed on the anterior lip of the cervix to stabilize it. The 3 mm pipelle was introduced into the endometrial cavity without difficulty to a depth of 8cm, and a moderate amount of tissue was obtained and sent to pathology. The instruments were removed from the patient's vagina. Minimal bleeding from the cervix was noted. The patient tolerated the procedure well.    CLINICAL DATA:  Enlarged uterus. History of uterine fibroids. LMP 11/11/2016.  EXAM: TRANSABDOMINAL AND TRANSVAGINAL ULTRASOUND OF PELVIS  TECHNIQUE: Both transabdominal and transvaginal ultrasound examinations of the pelvis were performed.  Transabdominal technique was performed for global imaging of the pelvis including uterus, ovaries, adnexal regions, and pelvic cul-de-sac. It was necessary to proceed with endovaginal exam following the transabdominal exam to visualize the endometrium, myometrium and adnexa.  COMPARISON:  01/19/2016 obstetric scan. 07/10/2006 CT abdomen/pelvis.  FINDINGS: Uterus  Measurements: 10.3 x 6.9 x 9.6 cm. Enlarged anteverted myomatous uterus, with representative fibroids as follows:  - left anterior fundal intramural 5.0 x 4.7 x 4.2 cm fibroid  - mid fundal intramural 2.8 x 2.7 x 2.5 cm fibroid  - right anterior upper uterine body intramural 2.0 x 1.7 x 1.6 cm fibroid  - posterior uterine body intramural 2.2 x 1.6 x 1.4 cm fibroid  Endometrium  Thickness: 16 mm. No endometrial cavity fluid or focal endometrial mass demonstrated.  Right ovary  Measurements: 4.9 x 2.7 x 3.3 cm. Normal appearance/no adnexal mass.  Left ovary  Measurements: 4.4 x 1.6 x 2.6 cm. Normal appearance/no adnexal mass.  Other findings  Small volume simple free fluid in the pelvis, nonspecific.  IMPRESSION: 1. Enlarged anteverted myomatous uterus as detailed. 2. Bilayer endometrial thickness 16 mm, upper normal. No focal endometrial mass demonstrated. 3. Normal ovaries.  No adnexal abnormality.  Routine post-procedure instructions were given to the patient. The patient will follow up to review the results and for further management in 2 weeks.    Kelli Luna, M.D., Cherlynn June

## 2016-12-13 NOTE — Patient Instructions (Signed)

## 2016-12-15 ENCOUNTER — Telehealth: Payer: Self-pay | Admitting: Obstetrics & Gynecology

## 2016-12-15 NOTE — Telephone Encounter (Signed)
TC to pt to review surg path. Left message.  Roselyn Doby L. Harraway-Smith, M.D., Cherlynn June

## 2016-12-16 ENCOUNTER — Telehealth: Payer: Self-pay

## 2016-12-16 NOTE — Telephone Encounter (Signed)
Please call pt. Her colpo bx and her endo bx were both neg! She can f/u in 1 year or sooner prn  I have left a message for patient to call us back regarding biopsy results.

## 2016-12-22 ENCOUNTER — Encounter: Payer: Self-pay | Admitting: *Deleted

## 2016-12-22 NOTE — Telephone Encounter (Signed)
Called pt and left message that we are trying a gain to speak with her regarding test results. There is no emergency and she does not need to call us back. I will send a letter containing the information from the doctor. If she has questions after receiving the letter, she may call back.

## 2016-12-23 ENCOUNTER — Telehealth: Payer: Self-pay | Admitting: *Deleted

## 2016-12-23 NOTE — Telephone Encounter (Signed)
Received a call left on nurse voicemail on 12/22/16 at 1534.  Patient states she missed our call.  Thinks it is about test results.  Requests we return her call to [phone].  States it is okay to leave a detailed message if she doesn't answer.

## 2016-12-30 NOTE — Telephone Encounter (Signed)
Called pt and left message on her voicemail.  I stated that a letter was sent out last week informing her of the biopsy results which were negative for cancer.  She should follow up in 1 year or sooner if she has problems.

## 2018-02-06 ENCOUNTER — Telehealth: Payer: Self-pay | Admitting: General Practice

## 2018-02-06 NOTE — Telephone Encounter (Signed)
Called patient, no answer- message states call cannot be completed at this time. Will send letter.

## 2018-02-06 NOTE — Telephone Encounter (Signed)
-----   Message from Guss Bunde, MD sent at 02/03/2018  2:15 PM EDT ----- Regarding: pt needs appt for pa pand mammogram Patient had AGUS and colpo last year.  She has not come for her pap smear this Year.  She needs an appt for this AND a mammogram.  Very important.   Thanks!!  (Put note in Epic your communication with patient).

## 2018-07-10 ENCOUNTER — Ambulatory Visit: Admitting: Obstetrics & Gynecology

## 2018-07-10 ENCOUNTER — Telehealth: Payer: Self-pay | Admitting: Obstetrics & Gynecology

## 2018-07-10 NOTE — Telephone Encounter (Signed)
Patient is requesting a call back from the nurse. She has questions about the abnormal cells found.

## 2018-07-12 NOTE — Telephone Encounter (Signed)
LVM advising pt of her upcoming appointment, and asked if she still wanted her results over the phone that to give [place] a call back if she still needed to.

## 2018-08-07 ENCOUNTER — Ambulatory Visit (INDEPENDENT_AMBULATORY_CARE_PROVIDER_SITE_OTHER): Admitting: Obstetrics & Gynecology

## 2018-08-07 ENCOUNTER — Encounter: Payer: Self-pay | Admitting: Obstetrics & Gynecology

## 2018-08-07 VITALS — BP 127/82 | HR 86 | Wt 200.7 lb

## 2018-08-07 DIAGNOSIS — Z1151 Encounter for screening for human papillomavirus (HPV): Secondary | ICD-10-CM | POA: Diagnosis not present

## 2018-08-07 DIAGNOSIS — D219 Benign neoplasm of connective and other soft tissue, unspecified: Secondary | ICD-10-CM | POA: Diagnosis not present

## 2018-08-07 DIAGNOSIS — Z113 Encounter for screening for infections with a predominantly sexual mode of transmission: Secondary | ICD-10-CM

## 2018-08-07 DIAGNOSIS — Z01419 Encounter for gynecological examination (general) (routine) without abnormal findings: Secondary | ICD-10-CM

## 2018-08-07 DIAGNOSIS — Z1231 Encounter for screening mammogram for malignant neoplasm of breast: Secondary | ICD-10-CM

## 2018-08-07 DIAGNOSIS — Z124 Encounter for screening for malignant neoplasm of cervix: Secondary | ICD-10-CM

## 2018-08-07 NOTE — Progress Notes (Signed)
Subjective:     Kelli Luna is a 47 y.o. female here for a routine exam.  LMP 08/06/2018 26-28 day cycles. No bleeding menses.  Current complaints: heavy bleeding with menses. No intermenstrual bleeding.     Gynecologic History No LMP recorded. Contraception: abstinence Last Pap: 11/09/2016. Results were: abnormal s/p colpo  Last mammogram: never had  Obstetric History OB History  Gravida Para Term Preterm AB Living  3 1 0 1 1    SAB TAB Ectopic Multiple Live Births  1            # Outcome Date GA Lbr Len/2nd Weight Sex Delivery Anes PTL Lv  3 Gravida           2 SAB 1994          1 Preterm     M    FD    Obstetric Comments  Was 5-6 months   The following portions of the patient's history were reviewed and updated as appropriate: allergies, current medications, past family history, past medical history, past social history, past surgical history and problem list.  Review of Systems Pertinent items are noted in HPI.    Objective:  BP 127/82   Pulse 86   Wt 200 lb 11.2 oz (91 kg)   BMI 32.39 kg/m  General Appearance:    Alert, cooperative, no distress, appears stated age  Head:    Normocephalic, without obvious abnormality, atraumatic  Eyes:    conjunctiva/corneas clear, EOM's intact, both eyes  Ears:    Normal external ear canals, both ears  Nose:   Nares normal, septum midline, mucosa normal, no drainage    or sinus tenderness  Throat:   Lips, mucosa, and tongue normal; teeth and gums normal  Neck:   Supple, symmetrical, trachea midline, no adenopathy;    thyroid:  no enlargement/tenderness/nodules  Back:     Symmetric, no curvature, ROM normal, no CVA tenderness  Lungs:     Clear to auscultation bilaterally, respirations unlabored  Chest Wall:    No tenderness or deformity   Heart:    Regular rate and rhythm, S1 and S2 normal, no murmur, rub   or gallop  Breast Exam:    No tenderness, masses, or nipple abnormality  Abdomen:     Soft, non-tender, bowel sounds active  all four quadrants,    no masses, no organomegaly  Genitalia:    Normal female without lesion, discharge or tenderness; enlarged uterus ~14-16 weeks sized. Mobile      Extremities:   Extremities normal, atraumatic, no cyanosis or edema  Pulses:   2+ and symmetric all extremities  Skin:   Skin color, texture, turgor normal, no rashes or lesions   11/23/2016 CLINICAL DATA:  Enlarged uterus. History of uterine fibroids. LMP 11/11/2016.  EXAM: TRANSABDOMINAL AND TRANSVAGINAL ULTRASOUND OF PELVIS  TECHNIQUE: Both transabdominal and transvaginal ultrasound examinations of the pelvis were performed. Transabdominal technique was performed for global imaging of the pelvis including uterus, ovaries, adnexal regions, and pelvic cul-de-sac. It was necessary to proceed with endovaginal exam following the transabdominal exam to visualize the endometrium, myometrium and adnexa.  COMPARISON:  01/19/2016 obstetric scan. 07/10/2006 CT abdomen/pelvis.  FINDINGS: Uterus  Measurements: 10.3 x 6.9 x 9.6 cm. Enlarged anteverted myomatous uterus, with representative fibroids as follows:  - left anterior fundal intramural 5.0 x 4.7 x 4.2 cm fibroid  - mid fundal intramural 2.8 x 2.7 x 2.5 cm fibroid  - right anterior upper uterine body intramural 2.0 x  1.7 x 1.6 cm fibroid  - posterior uterine body intramural 2.2 x 1.6 x 1.4 cm fibroid  Endometrium  Thickness: 16 mm. No endometrial cavity fluid or focal endometrial mass demonstrated.  Right ovary  Measurements: 4.9 x 2.7 x 3.3 cm. Normal appearance/no adnexal mass.  Left ovary  Measurements: 4.4 x 1.6 x 2.6 cm. Normal appearance/no adnexal mass.  Other findings  Small volume simple free fluid in the pelvis, nonspecific.  IMPRESSION: 1. Enlarged anteverted myomatous uterus as detailed. 2. Bilayer endometrial thickness 16 mm, upper normal. No focal endometrial mass demonstrated. 3. Normal ovaries.  No adnexal  abnormality.  Assessment:    Healthy female exam.   Enlarged uterus- h/o fibroids. Pt wants referral to interventional radiology.  STI screen     Plan:     Kelli Luna was seen today for abnormal pap smear.  Diagnoses and all orders for this visit:  Visit for screening mammogram -     Cancel: MM Digital Screening; Future -     Cancel: MM 3D SCREEN BREAST BILATERAL; Future -     MM DIGITAL SCREENING BILATERAL; Future  Fibroids -     US PELVIS TRANSVANGINAL NON-OB (TV ONLY); Future -     US PELVIS (TRANSABDOMINAL ONLY); Future  Well female exam with routine gynecological exam -     CBC -     Comp Met (CMET) -     TSH -     Hemoglobin A1c  Routine screening for STI (sexually transmitted infection) -     HIV antibody (with reflex) -     RPR -     Hepatitis B surface antigen -     Hepatitis C antibody  referral to interventional radiology for uterine fibroid embolization  Bufford Helms L. Harraway-Smith, M.D., Cherlynn June

## 2018-08-07 NOTE — Progress Notes (Signed)
[facility]th @ 1400.   [place] scheduled for January 7th @ 1400.   IR scheduled for January 16th @ 1415 Pt notified of all appts.

## 2018-08-07 NOTE — Patient Instructions (Signed)
Uterine Artery Embolization for Fibroids  Uterine artery embolization is a procedure to shrink uterine fibroids. Uterine fibroids are masses of tissue (tumors) that can develop in the womb (uterus). They are also called leiomyomas. This type of tumor is not cancerous (benign) and does not spread to other parts of the body outside of the pelvic area. The pelvic area is the part of the body between the hip bones. You can have one or many fibroids. Fibroids can vary in size, shape, weight, and where they grow in the uterus. Some can become quite large. In this procedure, a thin plastic tube (catheter) is used to inject a chemical that blocks off the blood supply to the fibroid, which causes the fibroid to shrink. Tell a health care provider about:  Any allergies you have.  All medicines you are taking, including vitamins, herbs, eye drops, creams, and over-the-counter medicines.  Any problems you or family members have had with anesthetic medicines.  Any blood disorders you have.  Any surgeries you have had.  Any medical conditions you have.  Whether you are pregnant or may be pregnant. What are the risks? Generally, this is a safe procedure. However, problems may occur, including:  Bleeding.  Allergic reactions to medicines or dyes.  Damage to other structures or organs.  Infection, including blood infection (septicemia).  Injury to the uterus from decreased blood supply.  Lack of menstrual periods (amenorrhea).  Death of tissue cells (necrosis) around your bladder or vulva.  Development of a hole between organs or from an organ to the surface of your skin (fistula).  Blood clot in the legs (deep vein thrombosis) or lung (pulmonary embolus).  Nausea and vomiting. What happens before the procedure? Staying hydrated Follow instructions from your health care provider about hydration, which may include:  Up to 2 hours before the procedure - you may continue to drink clear  liquids, such as water, clear fruit juice, black coffee, and plain tea. Eating and drinking restrictions Follow instructions from your health care provider about eating and drinking, which may include:  8 hours before the procedure - stop eating heavy meals or foods such as meat, fried foods, or fatty foods.  6 hours before the procedure - stop eating light meals or foods, such as toast or cereal.  6 hours before the procedure - stop drinking milk or drinks that contain milk.  2 hours before the procedure - stop drinking clear liquids. Medicines  Ask your health care provider about: ? Changing or stopping your regular medicines. This is especially important if you are taking diabetes medicines or blood thinners. ? Taking over-the-counter medicines, vitamins, herbs, and supplements. ? Taking medicines such as aspirin and ibuprofen. These medicines can thin your blood. Do not take these medicines unless your health care provider tells you to take them.  You may be given antibiotic medicine to help prevent infection.  You may be given medicine to prevent nausea and vomiting (antiemetic). General instructions  Ask your health care provider how your surgical site will be marked or identified.  You may be asked to shower with a germ-killing soap.  Plan to have someone take you home from the hospital or clinic.  If you will be going home right after the procedure, plan to have someone with you for 24 hours.  You will be asked to empty your bladder. What happens during the procedure?  To lower your risk of infection: ? Your health care team will wash or sanitize their hands. ?   right after the procedure, plan to have someone with you for 24 hours.  · You will be asked to empty your bladder.  What happens during the procedure?  · To lower your risk of infection:  ? Your health care team will wash or sanitize their hands.  ? Hair may be removed from the surgical area.  ? Your skin will be washed with soap.  · An IV will be inserted into one of your veins.  · You will be given one or more of the following:  ? A medicine to help you relax (sedative).  ? A medicine to numb the area (local anesthetic).  · A small cut (incision) will be made  in your groin.  · A catheter will be inserted into the main artery of your leg. The catheter will be guided through the artery to your uterus.  · A series of images will be taken while dye is injected through the catheter in your groin. X-rays are taken at the same time. This is done to provide a road map of the blood supply to your uterus and fibroids.  · Tiny plastic spheres, about the size of sand grains, will be injected through the catheter. Metal coils may be used to help block the artery. The particles will lodge in tiny branches of the uterine artery that supplies blood to the fibroids.  · The procedure will be repeated on the artery that supplies the other side of the uterus.  · The catheter will be removed and pressure will be applied to stop the bleeding.  · A dressing will be placed over the incision.  The procedure may vary among health care providers and hospitals.  What happens after the procedure?  · Your blood pressure, heart rate, breathing rate, and blood oxygen level will be monitored until the medicines you were given have worn off.  · You will be given pain medicine as needed.  · You may be given medicine for nausea and vomiting as needed.  · Do not drive for 24 hours if you were given a sedative.  Summary  · Uterine artery embolization is a procedure to shrink uterine fibroids by blocking the blood supply to the fibroid.  · You may be given a sedative and local anesthetic for the procedure.  · A catheter will be inserted into the main artery of your leg. The catheter will be guided through the artery to your uterus.  · After the procedure you will be given pain medicine and medicine for nausea as needed.  · Do not drive for 24 hours if you were given a sedative.  This information is not intended to replace advice given to you by your health care provider. Make sure you discuss any questions you have with your health care provider.  Document Released: 10/11/2005 Document Revised: 02/28/2017  Document Reviewed: 10/28/2016  Elsevier Interactive Patient Education © 2019 [employer]

## 2018-08-08 LAB — COMPREHENSIVE METABOLIC PANEL
ALT: 10 IU/L (ref 0–32)
AST: 16 IU/L (ref 0–40)
Albumin/Globulin Ratio: 1.6 (ref 1.2–2.2)
Albumin: 4.2 g/dL (ref 3.5–5.5)
Alkaline Phosphatase: 59 IU/L (ref 39–117)
BUN/Creatinine Ratio: 11 (ref 9–23)
BUN: 9 mg/dL (ref 6–24)
Bilirubin Total: 0.4 mg/dL (ref 0.0–1.2)
CO2: 19 mmol/L — ABNORMAL LOW (ref 20–29)
Calcium: 8.8 mg/dL (ref 8.7–10.2)
Chloride: 106 mmol/L (ref 96–106)
Creatinine, Ser: 0.83 mg/dL (ref 0.57–1.00)
GFR calc Af Amer: 97 mL/min/{1.73_m2} (ref >59)
GFR calc non Af Amer: 84 mL/min/{1.73_m2} (ref >59)
GLUCOSE: 100 mg/dL — AB (ref 65–99)
Globulin, Total: 2.7 g/dL (ref 1.5–4.5)
Potassium: 4 mmol/L (ref 3.5–5.2)
Sodium: 139 mmol/L (ref 134–144)
Total Protein: 6.9 g/dL (ref 6.0–8.5)

## 2018-08-08 LAB — TSH: TSH: 1.09 u[IU]/mL (ref 0.450–4.500)

## 2018-08-08 LAB — HEPATITIS C ANTIBODY: Hep C Virus Ab: 0.2 s/co ratio (ref 0.0–0.9)

## 2018-08-08 LAB — CBC
Hematocrit: 33.2 % — ABNORMAL LOW (ref 34.0–46.6)
Hemoglobin: 11.4 g/dL (ref 11.1–15.9)
MCH: 31.1 pg (ref 26.6–33.0)
MCHC: 34.3 g/dL (ref 31.5–35.7)
MCV: 91 fL (ref 79–97)
Platelets: 273 10*3/uL (ref 150–450)
RBC: 3.67 x10E6/uL — ABNORMAL LOW (ref 3.77–5.28)
RDW: 12.7 % (ref 12.3–15.4)
WBC: 4.4 10*3/uL (ref 3.4–10.8)

## 2018-08-08 LAB — RPR: RPR Ser Ql: NONREACTIVE

## 2018-08-08 LAB — HIV ANTIBODY (ROUTINE TESTING W REFLEX): HIV SCREEN 4TH GENERATION: NONREACTIVE

## 2018-08-08 LAB — HEPATITIS B SURFACE ANTIGEN: Hepatitis B Surface Ag: NEGATIVE

## 2018-08-08 LAB — HEMOGLOBIN A1C
ESTIMATED AVERAGE GLUCOSE: 108 mg/dL
Hgb A1c MFr Bld: 5.4 % (ref 4.8–5.6)

## 2018-08-11 LAB — CYTOLOGY - PAP
CHLAMYDIA, DNA PROBE: NEGATIVE
DIAGNOSIS: NEGATIVE
HPV: NOT DETECTED
Neisseria Gonorrhea: NEGATIVE
Trichomonas: NEGATIVE

## 2018-08-15 ENCOUNTER — Telehealth: Payer: Self-pay

## 2018-08-15 ENCOUNTER — Ambulatory Visit (HOSPITAL_COMMUNITY): Admission: RE | Admit: 2018-08-15 | Source: Ambulatory Visit

## 2018-08-15 NOTE — Telephone Encounter (Addendum)
-----   Message from Lavonia Drafts, MD sent at 08/10/2018 10:24 AM EST ----- Please call pt. Her labs were WNL including her STI screen.   Please make sure she has a referral to Interventional Radiology for Uterine Artery Embolization.  clh-S   Attempted to contact pt of normal results.  Every time phone has a busy signal. Pt knows about IR appt.

## 2018-08-18 ENCOUNTER — Ambulatory Visit

## 2018-08-23 ENCOUNTER — Telehealth: Payer: Self-pay

## 2018-08-23 NOTE — Telephone Encounter (Signed)
Pt called stating that she needed her results and she never got her mammogram scheduled.  LM for pt that we are returning her call to please call back give a time that we can reach her because we have not been able to reach her in the past.

## 2018-08-24 ENCOUNTER — Other Ambulatory Visit

## 2018-08-29 ENCOUNTER — Telehealth: Payer: Self-pay | Admitting: *Deleted

## 2018-08-29 NOTE — Telephone Encounter (Signed)
Kelli Luna called today and left a voice message she is calling to get results and also someone was supposed to call her with mammogram and [place] appointments.

## 2018-08-30 NOTE — Telephone Encounter (Signed)
Have attempted x 4 to reach pt.  When number is dialed the number just has a busy signal.

## 2018-08-31 NOTE — Telephone Encounter (Signed)
Attempted to contact pt but received a busy signal.

## 2018-09-05 ENCOUNTER — Telehealth: Payer: Self-pay | Admitting: Emergency Medicine

## 2018-09-05 NOTE — Telephone Encounter (Signed)
Pt called and left a message on the nurse voicemail line stating that she needed someone to call her back with lab results, mammogram appointment, and a ultrasound for her fibroids. Per chart review, pt no showed for appointments on 1/7 and 1/10.  Returned pt phone call, LVM and informed pt to call back if she still had any questions, comments, or concerns.

## 2019-03-26 ENCOUNTER — Telehealth: Payer: Self-pay

## 2019-03-26 NOTE — Telephone Encounter (Signed)
Called patient back for results LVM to call back for results of last [place] per her request.

## 2019-05-19 ENCOUNTER — Encounter (HOSPITAL_COMMUNITY): Payer: Self-pay | Admitting: Emergency Medicine

## 2019-05-19 ENCOUNTER — Emergency Department (HOSPITAL_COMMUNITY)
Admission: EM | Admit: 2019-05-19 | Discharge: 2019-05-19 | Disposition: A | Discharge status: Home / Self Care | Attending: Emergency Medicine | Admitting: Emergency Medicine

## 2019-05-19 ENCOUNTER — Other Ambulatory Visit: Payer: Self-pay

## 2019-05-19 DIAGNOSIS — L03211 Cellulitis of face: Secondary | ICD-10-CM | POA: Diagnosis not present

## 2019-05-19 DIAGNOSIS — R220 Localized swelling, mass and lump, head: Secondary | ICD-10-CM | POA: Diagnosis present

## 2019-05-19 MED ORDER — SULFAMETHOXAZOLE-TRIMETHOPRIM 800-160 MG PO TABS: 1.0000 | ORAL_TABLET | Freq: Two times a day (BID) | ORAL | 0 refills | Status: AC

## 2019-05-19 MED ORDER — SULFAMETHOXAZOLE-TRIMETHOPRIM 800-160 MG PO TABS
1.0000 | ORAL_TABLET | Freq: Once | ORAL | Status: AC
  Administered 2019-05-19: 21:00:00 1 via ORAL
  Filled 2019-05-19: qty 1

## 2019-05-19 NOTE — ED Triage Notes (Signed)
Pt reports eye swelling since yesterday. Denies pain/fever, area is warm to the touch.

## 2019-05-19 NOTE — Discharge Instructions (Addendum)
See your Physician for recheck on Monday. Apply warm compresses 20 minutes 4 times a day for 20 minutes  Return if any increase in redness, swelling or fever

## 2019-05-19 NOTE — ED Provider Notes (Signed)
[facility] EMERGENCY DEPARTMENT Provider Note   CSN: [id number] Arrival date & time: 05/19/19  1653     History   Chief Complaint Chief Complaint  Patient presents with  . Facial Swelling    HPI Kelli Luna is a 48 y.o. female.     Pt complains of swelling in the right side of her face.  Pt reports swelling began yesterday.  Pt reports increased swelling today  No fever no chills  The history is provided by the patient. No language interpreter was used.    Past Medical History:  Diagnosis Date  . Anemia   . Infection    UTI  . Vitamin D deficiency     There are no active problems to display for this patient.   Past Surgical History:  Procedure Laterality Date  . DILATION AND CURETTAGE OF UTERUS       OB History    Gravida  3   Para  1   Term  0   Preterm  1   AB  1   Living        SAB  1   TAB      Ectopic      Multiple      Live Births           Obstetric Comments  Was 5-6 months         Home Medications    Prior to Admission medications   Medication Sig Start Date End Date Taking? Authorizing Provider  fexofenadine (ALLEGRA) 60 MG tablet Take 60 mg by mouth 2 (two) times daily.    [provider]  Ibuprofen-Diphenhydramine Cit (ADVIL PM) 200-38 MG TABS Take 1-2 tablets by mouth at bedtime as needed (FOR PAIN AND SLEEP).    [provider]  Prenatal Vit-Fe Fumarate-FA (PREPLUS) 27-1 MG TABS Take 1 tablet by mouth daily.    [provider]    Family History Family History  Problem Relation Age of Onset  . Lupus Mother     Social History Social History   Tobacco Use  . Smoking status: Never Smoker  . Smokeless tobacco: Never Used  Substance Use Topics  . Alcohol use: No  . Drug use: No     Allergies   Patient has no known allergies.   Review of Systems Review of Systems  All other systems reviewed and are negative.    Physical Exam Updated Vital Signs BP 131/73   Pulse 79    Temp 98.3 F (36.8 C)   Resp 18   Ht 5\' 6"  (1.676 m)   Wt 79.4 kg   SpO2 99%   BMI 28.25 kg/m   Physical Exam Vitals signs and nursing note reviewed.  Constitutional:      Appearance: She is well-developed.  HENT:     Head: Normocephalic.     Right Ear: Tympanic membrane normal.     Left Ear: Tympanic membrane normal.     Mouth/Throat:     Mouth: Mucous membranes are moist.  Eyes:     Extraocular Movements: Extraocular movements intact.     Pupils: Pupils are equal, round, and reactive to light.     Comments: Slight redness right cheek,  Slight swelling below right eyelid,  No eye or eyelid involvement.  No swelling above eye  Neck:     Musculoskeletal: Normal range of motion.  Cardiovascular:     Rate and Rhythm: Normal rate and regular rhythm.     Pulses: Normal  pulses.  Pulmonary:     Effort: Pulmonary effort is normal.  Abdominal:     General: There is no distension.  Musculoskeletal: Normal range of motion.  Skin:    General: Skin is warm.  Neurological:     General: No focal deficit present.     Mental Status: She is alert and oriented to person, place, and time.  Psychiatric:        Mood and Affect: Mood normal.      ED Treatments / Results  Labs (all labs ordered are listed, but only abnormal results are displayed) Labs Reviewed - No data to display  EKG None  Radiology No results found.  Procedures Procedures (including critical care time)  Medications Ordered in ED Medications - No data to display   Initial Impression / Assessment and Plan / ED Course  I have reviewed the triage vital signs and the nursing notes.  Pertinent labs & imaging results that were available during my care of the patient were reviewed by me and considered in my medical decision making (see chart for details).        MDM  Possible early cellulitis.  Pt given bactrim.  Pt advised warm compresses.  Recheck with primary on Monday.  Return if any problems.  Final  Clinical Impressions(s) / ED Diagnoses   Final diagnoses:  Facial cellulitis    ED Discharge Orders    None    An After Visit Summary was printed and given to the patient.    Fransico Meadow, PA-C 05/19/19 2048    Francine Graven, DO 05/23/19 2358

## 2019-08-09 ENCOUNTER — Encounter

## 2019-08-15 ENCOUNTER — Telehealth: Payer: Self-pay | Admitting: Family Medicine

## 2019-08-15 NOTE — Telephone Encounter (Signed)
Attempted to call patient several times to speak to her about changes made in the office affecting her appointment. I was not able to leave a voicemail message. I will send her a MyChart message.

## 2019-08-16 ENCOUNTER — Ambulatory Visit: Admitting: Obstetrics and Gynecology

## 2019-08-20 ENCOUNTER — Encounter: Payer: Self-pay | Admitting: Family Medicine

## 2019-08-20 ENCOUNTER — Other Ambulatory Visit: Payer: Self-pay

## 2019-08-20 ENCOUNTER — Ambulatory Visit (INDEPENDENT_AMBULATORY_CARE_PROVIDER_SITE_OTHER): Admitting: Family Medicine

## 2019-08-20 VITALS — BP 94/52 | HR 77 | Wt 200.9 lb

## 2019-08-20 DIAGNOSIS — Z113 Encounter for screening for infections with a predominantly sexual mode of transmission: Secondary | ICD-10-CM

## 2019-08-20 DIAGNOSIS — D259 Leiomyoma of uterus, unspecified: Secondary | ICD-10-CM

## 2019-08-20 DIAGNOSIS — B9689 Other specified bacterial agents as the cause of diseases classified elsewhere: Secondary | ICD-10-CM

## 2019-08-20 DIAGNOSIS — Z124 Encounter for screening for malignant neoplasm of cervix: Secondary | ICD-10-CM

## 2019-08-20 DIAGNOSIS — Z01419 Encounter for gynecological examination (general) (routine) without abnormal findings: Secondary | ICD-10-CM | POA: Insufficient documentation

## 2019-08-20 DIAGNOSIS — Z1231 Encounter for screening mammogram for malignant neoplasm of breast: Secondary | ICD-10-CM | POA: Diagnosis not present

## 2019-08-20 DIAGNOSIS — Z1239 Encounter for other screening for malignant neoplasm of breast: Secondary | ICD-10-CM

## 2019-08-20 DIAGNOSIS — N898 Other specified noninflammatory disorders of vagina: Secondary | ICD-10-CM | POA: Diagnosis not present

## 2019-08-20 DIAGNOSIS — N760 Acute vaginitis: Secondary | ICD-10-CM

## 2019-08-20 DIAGNOSIS — Z1151 Encounter for screening for human papillomavirus (HPV): Secondary | ICD-10-CM | POA: Diagnosis not present

## 2019-08-20 NOTE — Progress Notes (Signed)
GYNECOLOGY ANNUAL PREVENTATIVE CARE ENCOUNTER NOTE  Subjective:   Kelli Luna is a 49 y.o. G36P0110 female here for a annual gynecologic exam. Current complaints: more irritable- stressed due to working in [place], COVID19, wearing N95 masks, and moving to a new apartment.   Denies abnormal vaginal bleeding, discharge, pelvic pain, problems with intercourse or other gynecologic concerns.    Gynecologic History Patient's last menstrual period was 08/15/2019 (approximate). Contraception: none, interested in Depo Last Pap: 2019. Results were: atypical glandular cells Last mammogram: 2016. Results were: normal per patient Gardisil: is not elligible  Obstetric History OB History  Gravida Para Term Preterm AB Living  3 1 0 1 1    SAB TAB Ectopic Multiple Live Births  1            # Outcome Date GA Lbr Len/2nd Weight Sex Delivery Anes PTL Lv  3 Gravida           2 SAB 1994          1 Preterm     M    FD    Obstetric Comments  Was 5-6 months    Past Medical History:  Diagnosis Date  . Anemia   . Infection    UTI  . Vitamin D deficiency     Past Surgical History:  Procedure Laterality Date  . DILATION AND CURETTAGE OF UTERUS      Current Outpatient Medications on File Prior to Visit  Medication Sig Dispense Refill  . fexofenadine (ALLEGRA) 60 MG tablet Take 60 mg by mouth 2 (two) times daily.    . Ibuprofen-Diphenhydramine Cit (ADVIL PM) 200-38 MG TABS Take 1-2 tablets by mouth at bedtime as needed (FOR PAIN AND SLEEP).    . Prenatal Vit-Fe Fumarate-FA (PREPLUS) 27-1 MG TABS Take 1 tablet by mouth daily.     No current facility-administered medications on file prior to visit.    No Known Allergies  Social History   Socioeconomic History  . Marital status: Married    Spouse name: Not on file  . Number of children: Not on file  . Years of education: Not on file  . Highest education level: Not on file  Occupational History  . Not on file  Tobacco Use  .  Smoking status: Never Smoker  . Smokeless tobacco: Never Used  Substance and Sexual Activity  . Alcohol use: No  . Drug use: No  . Sexual activity: Not on file  Other Topics Concern  . Not on file  Social History Narrative  . Not on file   Social Determinants of Health   Financial Resource Strain:   . Difficulty of Paying Living Expenses: Not on file  Food Insecurity:   . Worried About Charity fundraiser in the Last Year: Not on file  . Ran Out of Food in the Last Year: Not on file  Transportation Needs:   . Lack of Transportation (Medical): Not on file  . Lack of Transportation (Non-Medical): Not on file  Physical Activity:   . Days of Exercise per Week: Not on file  . Minutes of Exercise per Session: Not on file  Stress:   . Feeling of Stress : Not on file  Social Connections:   . Frequency of Communication with Friends and Family: Not on file  . Frequency of Social Gatherings with Friends and Family: Not on file  . Attends Religious Services: Not on file  . Active Member of Clubs or Organizations: Not  on file  . Attends Archivist Meetings: Not on file  . Marital Status: Not on file  Intimate Partner Violence:   . Fear of Current or Ex-Partner: Not on file  . Emotionally Abused: Not on file  . Physically Abused: Not on file  . Sexually Abused: Not on file    Family History  Problem Relation Age of Onset  . Lupus Mother     Diet: varied Exercise: denies  The following portions of the patient's history were reviewed and updated as appropriate: allergies, current medications, past family history, past medical history, past social history, past surgical history and problem list.  Review of Systems Pertinent items are noted in HPI.   Objective:  BP (!) 94/52   Pulse 77   Wt 200 lb 14.4 oz (91.1 kg)   LMP 08/15/2019 (Approximate)   BMI 32.43 kg/m  CONSTITUTIONAL: Well-developed, well-nourished female in no acute distress.  HENT:  Normocephalic,  atraumatic, External right and left ear normal. Oropharynx is clear and moist EYES: Conjunctivae and EOM are normal. Pupils are equal, round, and reactive to light. No scleral icterus.  NECK: Normal range of motion, supple, no masses.  Normal thyroid.  SKIN: Skin is warm and dry. No rash noted. Not diaphoretic. No erythema. No pallor. NEUROLOGIC: Alert and oriented to person, place, and time. Normal reflexes, muscle tone coordination. No cranial nerve deficit noted. PSYCHIATRIC: Normal mood and affect. Normal behavior. Normal judgment and thought content. CARDIOVASCULAR: Normal heart rate noted, regular rhythm RESPIRATORY: Clear to auscultation bilaterally. Effort and breath sounds normal, no problems with respiration noted. BREASTS: Symmetric in size. Declines breast exam today ABDOMEN: Soft, normal bowel sounds, no distention noted.  No tenderness, rebound or guarding.  PELVIC: Normal appearing external genitalia; normal appearing vaginal mucosa and cervix.  No abnormal discharge noted.  Pap smear obtained.  Normal uterine size, no other palpable masses, no uterine or adnexal tenderness. MUSCULOSKELETAL: Normal range of motion. No tenderness.  No cyanosis, clubbing, or edema.  2+ distal pulses.  Exam done with chaperone present.  Assessment and Plan:   1. Well woman exam with routine gynecological exam - Requesting lab work - Cervicovaginal ancillary only( [place]) - HIV antibody - Hepatitis B Surface AntiGEN - RPR - Hepatitis C Antibody - Cytology - PAP( [place]) - Hemoglobin A1c - TSH - Lipid Profile  2. Uterine leiomyoma, unspecified location - Discussed hormonal treatment with Depo, Nexplanon, or IUD for heavy periods; wants information on Depo  3. Encounter for screening mammogram for breast cancer - Cytology - PAP( [place]) - MM 3D SCREEN BREAST BILATERAL; Future   Will follow up results of pap smear/STI screen and manage accordingly. Encouraged improvement  in diet and exercise.  Mammogram ordered Referral for colonoscopy not indicated Flu vaccine declined today Gardisil n/a  Routine preventative health maintenance measures emphasized. Please refer to After Visit Summary for other counseling recommendations.   Total face-to-face time with patient: 23 minutes. Over 50% of encounter was spent on counseling and coordination of care.  Merilyn Baba, DO OB Fellow, Faculty [facility] 08/20/2019 10:08 AM

## 2019-08-20 NOTE — Patient Instructions (Addendum)
Pap Test Why am I having this test? A Pap test, also called a Pap smear, is a screening test to check for signs of:  Cancer of the vagina, cervix, and uterus. The cervix is the lower part of the uterus that opens into the vagina.  Infection.  Changes that may be a sign that cancer is developing (precancerous changes). Women need this test on a regular basis. In general, you should have a Pap test every 3 years until you reach menopause or age 49. Women aged 30-60 may choose to have their Pap test done at the same time as an HPV (human papillomavirus) test every 5 years (instead of every 3 years). Your health care provider may recommend having Pap tests more or less often depending on your medical conditions and past Pap test results. What kind of sample is taken?  Your health care provider will collect a sample of cells from the surface of your cervix. This will be done using a small cotton swab, plastic spatula, or brush. This sample is often collected during a pelvic exam, when you are lying on your back on an exam table with feet in footrests (stirrups). In some cases, fluids (secretions) from the cervix or vagina may also be collected. How do I prepare for this test?  Be aware of where you are in your menstrual cycle. If you are menstruating on the day of the test, you may be asked to reschedule.  You may need to reschedule if you have a known vaginal infection on the day of the test.  Follow instructions from your health care provider about: ? Changing or stopping your regular medicines. Some medicines can cause abnormal test results, such as digitalis and tetracycline. ? Avoiding douching or taking a bath the day before or the day of the test. Tell a health care provider about:  Any allergies you have.  All medicines you are taking, including vitamins, herbs, eye drops, creams, and over-the-counter medicines.  Any blood disorders you have.  Any surgeries you have had.  Any  medical conditions you have.  Whether you are pregnant or may be pregnant. How are the results reported? Your test results will be reported as either abnormal or normal. A false-positive result can occur. A false positive is incorrect because it means that a condition is present when it is not. A false-negative result can occur. A false negative is incorrect because it means that a condition is not present when it is. What do the results mean? A normal test result means that you do not have signs of cancer of the vagina, cervix, or uterus. An abnormal result may mean that you have:  Cancer. A Pap test by itself is not enough to diagnose cancer. You will have more tests done in this case.  Precancerous changes in your vagina, cervix, or uterus.  Inflammation of the cervix.  An STD (sexually transmitted disease).  A fungal infection.  A parasite infection. Talk with your health care provider about what your results mean. Questions to ask your health care provider Ask your health care provider, or the department that is doing the test:  When will my results be ready?  How will I get my results?  What are my treatment options?  What other tests do I need?  What are my next steps? Summary  In general, women should have a Pap test every 3 years until they reach menopause or age 49.  Your health care provider will collect a   sample of cells from the surface of your cervix. This will be done using a small cotton swab, plastic spatula, or brush.  In some cases, fluids (secretions) from the cervix or vagina may also be collected. This information is not intended to replace advice given to you by your health care provider. Make sure you discuss any questions you have with your health care provider. Document Revised: 04/04/2017 Document Reviewed: 04/04/2017 Elsevier Patient Education  [place] A mammogram is an X-ray of the breasts that is done to check for  changes that are not normal. This test can screen for and find any changes that may suggest breast cancer. Mammograms are regularly done on women. A man may have a mammogram if he has a lump or swelling in his breast. This test can also help to find other changes and variations in the breast. Tell a doctor:  About any allergies you have.  If you have breast implants.  If you have had previous breast disease, biopsy, or surgery.  If you are breastfeeding.  If you are younger than age 19.  If you have a family history of breast cancer.  Whether you are pregnant or may be pregnant. What are the risks? Generally, this is a safe procedure. However, problems may occur, including:  Exposure to radiation. Radiation levels are very low with this test.  The results being misinterpreted.  The need for further tests.  The inability of the mammogram to detect certain cancers. What happens before the procedure?  Have this test done about 1-2 weeks after your period. This is usually when your breasts are the least tender.  If you are visiting a new doctor or clinic, send any past mammogram images to your new doctor's office.  Wash your breasts and under your arms the day of the test.  Do not use deodorants, perfumes, lotions, or powders on the day of the test.  Take off any jewelry from your neck.  Wear clothes that you can change into and out of easily. What happens during the procedure?   You will undress from the waist up. You will put on a gown.  You will stand in front of the X-ray machine.  Each breast will be placed between two plastic or glass plates. The plates will press down on your breast for a few seconds. Try to stay as relaxed as possible. This does not cause any harm to your breasts. Any discomfort you feel will be very brief.  X-rays will be taken from different angles of each breast. The procedure may vary among doctors and hospitals. What happens after the  procedure?  The mammogram will be read by a specialist (radiologist).  You may need to do certain parts of the test again. This depends on the quality of the images.  Ask when your test results will be ready. Make sure you get your test results.  You may go back to your normal activities. Summary  A mammogram is a low energy X-ray of the breasts that is done to check for abnormal changes. A man may have this test if he has a lump or swelling in his breast.  Before the procedure, tell your doctor about any breast problems that you have had in the past.  Have this test done about 1-2 weeks after your period.  For the test, each breast will be placed between two plastic or glass plates. The plates will press down on your breast for a  few seconds.  The mammogram will be read by a specialist (radiologist). Ask when your test results will be ready. Make sure you get your test results. This information is not intended to replace advice given to you by your health care provider. Make sure you discuss any questions you have with your health care provider. Document Revised: 03/16/2018 Document Reviewed: 03/16/2018 Elsevier Patient Education  2020 [employer].   Hormonal Contraception Information Hormonal contraception is a type of birth control that uses hormones to prevent pregnancy. It usually involves a combination of the hormones estrogen and progesterone or only the hormone progesterone. Hormonal contraception works in these ways:  It thickens the mucus in the cervix, making it harder for sperm to enter the uterus.  It changes the lining of the uterus, making it harder for an egg to implant.  It may stop the ovaries from releasing eggs (ovulation). Some women who take hormonal contraceptives that contain only progesterone may continue to ovulate. Hormonal contraception cannot prevent sexually transmitted infections (STIs). Pregnancy may still occur. Estrogen and progesterone  contraceptives Contraceptives that use a combination of estrogen and progesterone are available in these forms:  Pill. Pills come in different combinations of hormones. They must be taken at the same time each day. Pills can affect your period, causing you to get your period once every three months or not at all.  Patch. The patch must be worn on the lower abdomen for three weeks and then removed on the fourth.  Vaginal ring. The ring is placed in the vagina and left there for three weeks. It is then removed for one week. Progesterone contraceptives Contraceptives that use progesterone only are available in these forms:  Pill. Pills should be taken every day of the cycle.  Intrauterine device (IUD). This device is inserted into the uterus and removed or replaced every five years or sooner.  Implant. Plastic rods are placed under the skin of the upper arm. They are removed or replaced every three years or sooner.  Injection. The injection is given once every 90 days. What are the side effects? The side effects of estrogen and progesterone contraceptives include:  Nausea.  Headaches.  Breast tenderness.  Bleeding or spotting between menstrual cycles.  High blood pressure (rare).  Strokes, heart attacks, or blood clots (rare) Side effects of progesterone-only contraceptives include:  Nausea.  Headaches.  Breast tenderness.  Unpredictable menstrual bleeding.  High blood pressure (rare). Talk to your health care provider about what side effects may affect you. Where to find more information  Ask your health care provider for more information and resources about hormonal contraception.  [place] Department of Health and Programmer, systems on Women's Health: [place] Questions to ask:  What type of hormonal contraception is right for me?  How long should I plan to use hormonal contraception?  What are the side effects of the hormonal contraception method I  choose?  How can I prevent STIs while using hormonal contraception? Contact a health care provider if:  You start taking hormonal contraceptives and you develop persistent or severe side effects. Summary  Estrogen and progesterone are hormones used in many forms of birth control.  Talk to your health care provider about what side effects may affect you.  Hormonal contraception cannot prevent sexually transmitted infections (STIs).  Ask your health care provider for more information and resources about hormonal contraception. This information is not intended to replace advice given to you by your health care provider. Make sure you  discuss any questions you have with your health care provider. Document Revised: 11/20/2018 Document Reviewed: 06/25/2016 Elsevier Patient Education  [place].  Medroxyprogesterone injection [Contraceptive] What is this medicine? MEDROXYPROGESTERONE (me DROX ee proe JES te rone) contraceptive injections prevent pregnancy. They provide effective birth control for 3 months. Depo-subQ Provera 104 is also used for treating pain related to endometriosis. This medicine may be used for other purposes; ask your health care provider or pharmacist if you have questions. COMMON BRAND NAME(S): Depo-Provera, Depo-subQ Provera 104 What should I tell my health care provider before I take this medicine? They need to know if you have any of these conditions:  frequently drink alcohol  asthma  blood vessel disease or a history of a blood clot in the lungs or legs  bone disease such as osteoporosis  breast cancer  diabetes  eating disorder (anorexia nervosa or bulimia)  high blood pressure  HIV infection or AIDS  kidney disease  liver disease  mental depression  migraine  seizures (convulsions)  stroke  tobacco smoker  vaginal bleeding  an unusual or allergic reaction to medroxyprogesterone, other hormones, medicines, foods, dyes, or  preservatives  pregnant or trying to get pregnant  breast-feeding How should I use this medicine? Depo-Provera Contraceptive injection is given into a muscle. Depo-subQ Provera 104 injection is given under the skin. These injections are given by a health care professional. You must not be pregnant before getting an injection. The injection is usually given during the first 5 days after the start of a menstrual period or 6 weeks after delivery of a baby. Talk to your pediatrician regarding the use of this medicine in children. Special care may be needed. These injections have been used in female children who have started having menstrual periods. Overdosage: If you think you have taken too much of this medicine contact a poison control center or emergency room at once. NOTE: This medicine is only for you. Do not share this medicine with others. What if I miss a dose? Try not to miss a dose. You must get an injection once every 3 months to maintain birth control. If you cannot keep an appointment, call and reschedule it. If you wait longer than 13 weeks between Depo-Provera contraceptive injections or longer than 14 weeks between Depo-subQ Provera 104 injections, you could get pregnant. Use another method for birth control if you miss your appointment. You may also need a pregnancy test before receiving another injection. What may interact with this medicine? Do not take this medicine with any of the following medications:  bosentan This medicine may also interact with the following medications:  aminoglutethimide  antibiotics or medicines for infections, especially rifampin, rifabutin, rifapentine, and griseofulvin  aprepitant  barbiturate medicines such as phenobarbital or primidone  bexarotene  carbamazepine  medicines for seizures like ethotoin, felbamate, oxcarbazepine, phenytoin, topiramate  modafinil  St. John's wort This list may not describe all possible interactions. Give  your health care provider a list of all the medicines, herbs, non-prescription drugs, or dietary supplements you use. Also tell them if you smoke, drink alcohol, or use illegal drugs. Some items may interact with your medicine. What should I watch for while using this medicine? This drug does not protect you against HIV infection (AIDS) or other sexually transmitted diseases. Use of this product may cause you to lose calcium from your bones. Loss of calcium may cause weak bones (osteoporosis). Only use this product for more than 2 years if other forms  of birth control are not right for you. The longer you use this product for birth control the more likely you will be at risk for weak bones. Ask your health care professional how you can keep strong bones. You may have a change in bleeding pattern or irregular periods. Many females stop having periods while taking this drug. If you have received your injections on time, your chance of being pregnant is very low. If you think you may be pregnant, see your health care professional as soon as possible. Tell your health care professional if you want to get pregnant within the next year. The effect of this medicine may last a long time after you get your last injection. What side effects may I notice from receiving this medicine? Side effects that you should report to your doctor or health care professional as soon as possible:  allergic reactions like skin rash, itching or hives, swelling of the face, lips, or tongue  breast tenderness or discharge  breathing problems  changes in vision  depression  feeling faint or lightheaded, falls  fever  pain in the abdomen, chest, groin, or leg  problems with balance, talking, walking  unusually weak or tired  yellowing of the eyes or skin Side effects that usually do not require medical attention (report to your doctor or health care professional if they continue or are bothersome):  acne  fluid  retention and swelling  headache  irregular periods, spotting, or absent periods  temporary pain, itching, or skin reaction at site where injected  weight gain This list may not describe all possible side effects. Call your doctor for medical advice about side effects. You may report side effects to FDA at 1-800-FDA-1088. Where should I keep my medicine? This does not apply. The injection will be given to you by a health care professional. NOTE: This sheet is a summary. It may not cover all possible information. If you have questions about this medicine, talk to your doctor, pharmacist, or health care provider.  2020 Elsevier/Gold Standard (2008-08-16 18:37:56)

## 2019-08-21 LAB — CERVICOVAGINAL ANCILLARY ONLY
Bacterial Vaginitis (gardnerella): POSITIVE — AB
Candida Glabrata: NEGATIVE
Candida Vaginitis: NEGATIVE
Chlamydia: NEGATIVE
Comment: NEGATIVE
Comment: NEGATIVE
Comment: NEGATIVE
Comment: NEGATIVE
Comment: NEGATIVE
Comment: NORMAL
Neisseria Gonorrhea: NEGATIVE
Trichomonas: NEGATIVE

## 2019-08-21 LAB — HIV ANTIBODY (ROUTINE TESTING W REFLEX): HIV Screen 4th Generation wRfx: NONREACTIVE

## 2019-08-21 LAB — HEPATITIS C ANTIBODY: Hep C Virus Ab: <0.1 s/co ratio (ref 0.0–0.9)

## 2019-08-21 LAB — RPR: RPR Ser Ql: NONREACTIVE

## 2019-08-21 LAB — HEPATITIS B SURFACE ANTIGEN: Hepatitis B Surface Ag: NEGATIVE

## 2019-08-22 LAB — CYTOLOGY - PAP
Comment: NEGATIVE
Diagnosis: UNDETERMINED — AB
High risk HPV: NEGATIVE

## 2019-08-29 ENCOUNTER — Telehealth (INDEPENDENT_AMBULATORY_CARE_PROVIDER_SITE_OTHER): Admitting: General Practice

## 2019-08-29 DIAGNOSIS — R87610 Atypical squamous cells of undetermined significance on cytologic smear of cervix (ASC-US): Secondary | ICD-10-CM

## 2019-08-29 NOTE — Telephone Encounter (Signed)
-----   Message from Merilyn Baba, DO sent at 08/27/2019 11:34 PM EST ----- Patient has ASCUS, and previously had AGUS. She needs to be scheduled for colposcopy.

## 2019-08-29 NOTE — Telephone Encounter (Signed)
I called [facility] and left a message I am calling with non urgent information/results from your provider to discuss with you. please call our office. Ruthella Kirchman,RN

## 2019-08-30 NOTE — Telephone Encounter (Signed)
Called pt to inform her of her Pap results and need for Colposcopy. Pt with no questions or concerns. Pt informed the front office will call her and schedule her for Colposcopy. Message to front office to call and schedule Colpo.

## 2019-10-15 ENCOUNTER — Encounter: Payer: Self-pay | Admitting: Family Medicine

## 2019-10-15 ENCOUNTER — Ambulatory Visit (INDEPENDENT_AMBULATORY_CARE_PROVIDER_SITE_OTHER): Admitting: Family Medicine

## 2019-10-15 ENCOUNTER — Other Ambulatory Visit: Payer: Self-pay

## 2019-10-15 ENCOUNTER — Other Ambulatory Visit (HOSPITAL_COMMUNITY)
Admission: RE | Admit: 2019-10-15 | Discharge: 2019-10-15 | Disposition: A | Discharge status: Home / Self Care | Source: Ambulatory Visit | Attending: Family Medicine | Admitting: Family Medicine

## 2019-10-15 VITALS — BP 104/64 | HR 74 | Wt 201.4 lb

## 2019-10-15 DIAGNOSIS — Z3202 Encounter for pregnancy test, result negative: Secondary | ICD-10-CM

## 2019-10-15 DIAGNOSIS — R87610 Atypical squamous cells of undetermined significance on cytologic smear of cervix (ASC-US): Secondary | ICD-10-CM | POA: Diagnosis not present

## 2019-10-15 LAB — POCT PREGNANCY, URINE: Preg Test, Ur: NEGATIVE

## 2019-10-15 NOTE — Progress Notes (Signed)
Colposcopy Procedure Note  Kelli Luna is a 49 year old [id number] here for colposcopy.  Indications:  Pap 2018- Atypical Glandular Cells Colpo 2018- Cervical Bx and Endo Bx both negative Pap 2019- Negative Pap 2021- ASCUS, HPV negative  Procedure Details  LMP 10/04/19; UPT negative.    The risks (including infection, bleeding, pain) and benefits of the procedure were explained to the patient and written informed consent was obtained.  The patient was placed in the dorsal lithotomy position. A Graves was speculum inserted in the vagina, and the cervix was visualized.  The cervix was stained with acetic acid and visualized using the colposcope under magnification as well as with a green filter. Findings as below. Cervical biopsies were taken at 6 o'clock and 12 o'clock. Endocervical curettage then performed in all four quadrants. Small amount of bleeding noted that improved with pressure. Monsel's solution applied with good hemostasis noted. Patient tolerating procedure well.  Findings: Mild aceto-white changes at 12-3 o'clock and 5-7 o'clock. No cobblestoning or hypervascularity noted.  Impression: normal cervix  Adequate: yes  Specimens: ECC and Bx at 12 o'clock and 6 o'clock  Condition: Stable  Complications: None  Plan: The patient was advised to call for any fever or for prolonged or severe pain or bleeding. She was advised to use OTC analgesics as needed for mild to moderate pain. She was advised to avoid vaginal intercourse for 48 hours or until the bleeding has completely stopped.  Will base further management on results of biopsy.   Merilyn Baba, DO [facility] 10/15/2019 1:16 PM

## 2019-10-15 NOTE — Patient Instructions (Signed)
Colposcopy, Care After This sheet gives you information about how to care for yourself after your procedure. Your doctor may also give you more specific instructions. If you have problems or questions, contact your doctor. What can I expect after the procedure? If you did not have a tissue sample removed (did not have a biopsy), you may only have some spotting for a few days. You can go back to your normal activities. If you had a tissue sample removed, it is common to have:  Soreness and pain. This may last for a few days.  Light-headedness.  Mild bleeding from your vagina or dark-colored, grainy discharge from your vagina. This may last for a few days. You may need to wear a sanitary pad.  Spotting for at least 48 hours after the procedure. Follow these instructions at home:   Take over-the-counter and prescription medicines only as told by your doctor. Ask your doctor what medicines you can start taking again. This is very important if you take blood-thinning medicine.  Do not drive or use heavy machinery while taking prescription pain medicine.  For 3 days, or as long as your doctor tells you, avoid: ? Douching. ? Using tampons. ? Having sex.  If you use birth control (contraception), keep using it.  Limit activity for the first day after the procedure. Ask your doctor what activities are safe for you.  It is up to you to get the results of your procedure. Ask your doctor when your results will be ready.  Keep all follow-up visits as told by your doctor. This is important. Contact a doctor if:  You get a skin rash. Get help right away if:  You are bleeding a lot from your vagina. It is a lot of bleeding if you are using more than one pad an hour for 2 hours in a row.  You have clumps of blood (blood clots) coming from your vagina.  You have a fever.  You have chills  You have pain in your lower belly (pelvic area).  You have signs of infection, such as vaginal  discharge that is: ? Different than usual. ? Yellow. ? Bad-smelling.  You have very pain or cramps in your lower belly that do not get better with medicine.  You feel light-headed.  You feel dizzy.  You pass out (faint). Summary  If you did not have a tissue sample removed (did not have a biopsy), you may only have some spotting for a few days. You can go back to your normal activities.  If you had a tissue sample removed, it is common to have mild pain and spotting for 48 hours.  For 3 days, or as long as your doctor tells you, avoid douching, using tampons and having sex.  Get help right away if you have bleeding, very bad pain, or signs of infection. This information is not intended to replace advice given to you by your health care provider. Make sure you discuss any questions you have with your health care provider. Document Revised: 07/08/2017 Document Reviewed: 04/14/2016 Elsevier Patient Education  2020 Elsevier Inc.  

## 2019-10-18 LAB — SURGICAL PATHOLOGY

## 2019-10-31 ENCOUNTER — Other Ambulatory Visit: Payer: Self-pay

## 2019-10-31 ENCOUNTER — Encounter: Payer: Self-pay | Admitting: Family Medicine

## 2019-10-31 ENCOUNTER — Ambulatory Visit (INDEPENDENT_AMBULATORY_CARE_PROVIDER_SITE_OTHER): Admitting: Family Medicine

## 2019-10-31 VITALS — BP 107/61 | HR 74 | Wt 198.9 lb

## 2019-10-31 DIAGNOSIS — Z01419 Encounter for gynecological examination (general) (routine) without abnormal findings: Secondary | ICD-10-CM

## 2019-10-31 DIAGNOSIS — Z23 Encounter for immunization: Secondary | ICD-10-CM

## 2019-10-31 DIAGNOSIS — N870 Mild cervical dysplasia: Secondary | ICD-10-CM | POA: Insufficient documentation

## 2019-10-31 DIAGNOSIS — Z712 Person consulting for explanation of examination or test findings: Secondary | ICD-10-CM | POA: Diagnosis not present

## 2019-10-31 DIAGNOSIS — D259 Leiomyoma of uterus, unspecified: Secondary | ICD-10-CM | POA: Diagnosis not present

## 2019-10-31 NOTE — Assessment & Plan Note (Signed)
No symptoms, normal cycle of fibroids reviewed. No need for removal without symptoms.

## 2019-10-31 NOTE — Progress Notes (Signed)
   Subjective:    Patient ID: Kelli Luna is a 49 y.o. female presenting with Results  on 10/31/2019  HPI: Patient has h/o fibroids and wants to know what to do. Has no h/o heavy bleeding or pain. Has h/o ASCUS pap with negative HPV--had colpo which showed CiN1, neg ECC.  Review of Systems  Constitutional: Negative for chills and fever.  Respiratory: Negative for shortness of breath.   Cardiovascular: Negative for chest pain.  Gastrointestinal: Negative for abdominal pain, nausea and vomiting.  Genitourinary: Negative for dysuria.  Skin: Negative for rash.      Objective:    BP 107/61   Pulse 74   Wt 198 lb 14.4 oz (90.2 kg)   LMP 10/25/2019 (Exact Date)   BMI 32.10 kg/m  Physical Exam Constitutional:      General: She is not in acute distress.    Appearance: She is well-developed.  HENT:     Head: Normocephalic and atraumatic.  Eyes:     General: No scleral icterus. Cardiovascular:     Rate and Rhythm: Normal rate.  Pulmonary:     Effort: Pulmonary effort is normal.  Abdominal:     Palpations: Abdomen is soft. There is no mass.  Musculoskeletal:     Cervical back: Neck supple.  Skin:    General: Skin is warm and dry.  Neurological:     Mental Status: She is alert and oriented to person, place, and time.         Assessment & Plan:   Problem List Items Addressed This Visit      Unprioritized   Fibroid uterus    No symptoms, normal cycle of fibroids reviewed. No need for removal without symptoms.      Well woman exam with routine gynecological exam - Primary    Update TDAP.      Relevant Orders   Tdap vaccine greater than or equal to 7yo IM (Completed)   Dysplasia of cervix, low grade (CIN 1)    Reviewed results, f/u pap in 1 year.         Total time: 20 minutes.  Return in about 1 year (around 10/30/2020).  [name] 10/31/2019 1:14 PM

## 2019-10-31 NOTE — Assessment & Plan Note (Signed)
Update TDAP.

## 2019-10-31 NOTE — Assessment & Plan Note (Signed)
Reviewed results, f/u pap in 1 year.

## 2019-11-15 ENCOUNTER — Ambulatory Visit
Admission: RE | Admit: 2019-11-15 | Discharge: 2019-11-15 | Disposition: A | Discharge status: Home / Self Care | Source: Ambulatory Visit | Attending: Family Medicine | Admitting: Family Medicine

## 2019-11-15 ENCOUNTER — Other Ambulatory Visit: Payer: Self-pay

## 2019-11-15 DIAGNOSIS — Z1231 Encounter for screening mammogram for malignant neoplasm of breast: Secondary | ICD-10-CM

## 2019-11-28 ENCOUNTER — Encounter: Payer: Self-pay | Admitting: *Deleted

## 2020-04-09 DEATH — deceased

## 2021-06-14 IMAGING — MG DIGITAL SCREENING BILAT W/ TOMO W/ CAD
8 series · 8 of 24 positions shown · non-contrast
Comparison: None.

CLINICAL DATA: Screening.

EXAM:
DIGITAL SCREENING BILATERAL MAMMOGRAM WITH TOMO AND CAD

[R MLO synth-2D]
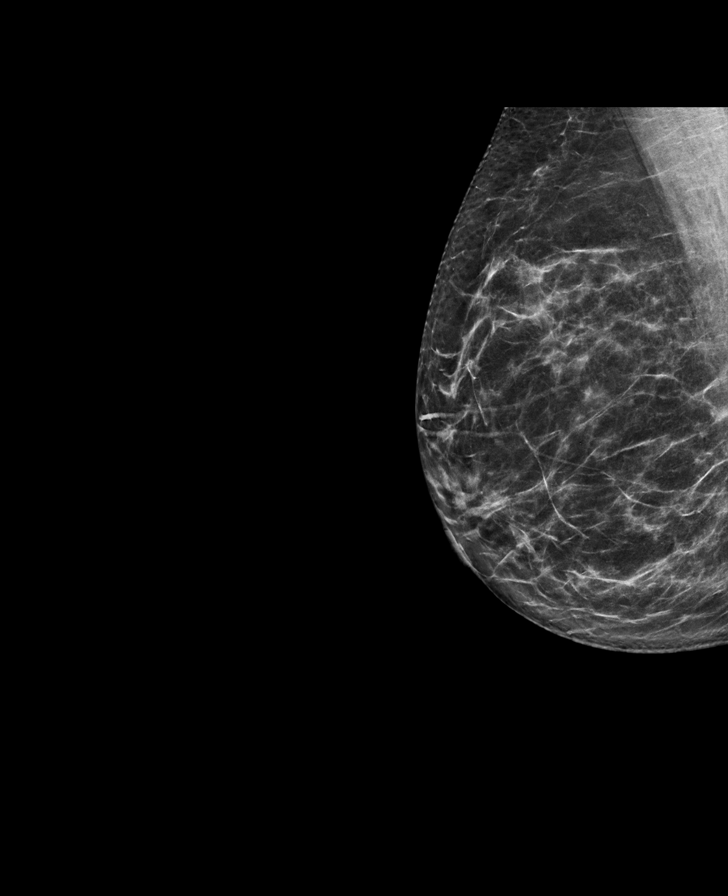

[R CC synth-2D]
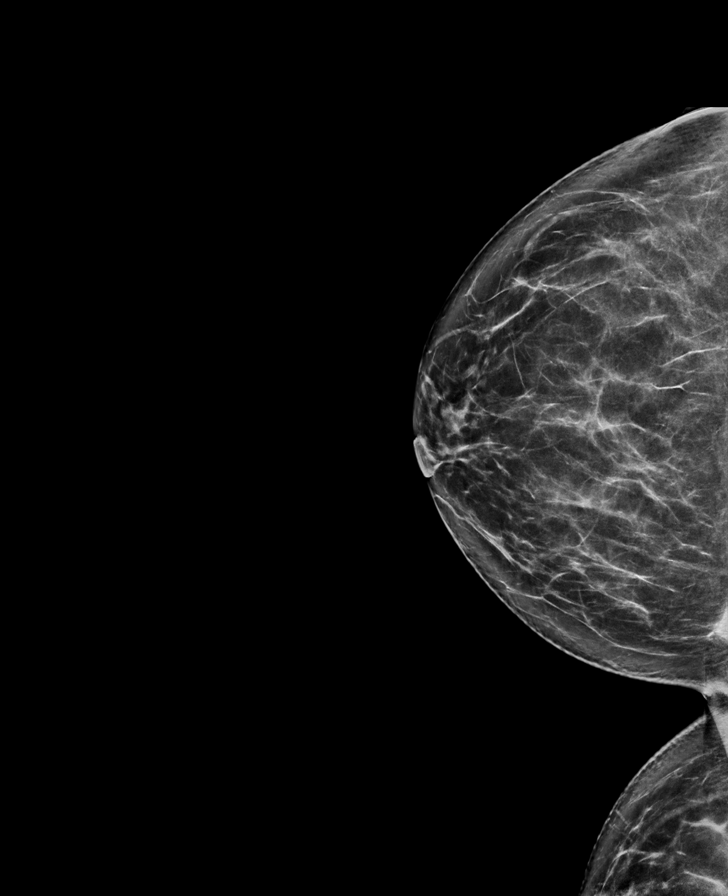

[L MLO synth-2D]
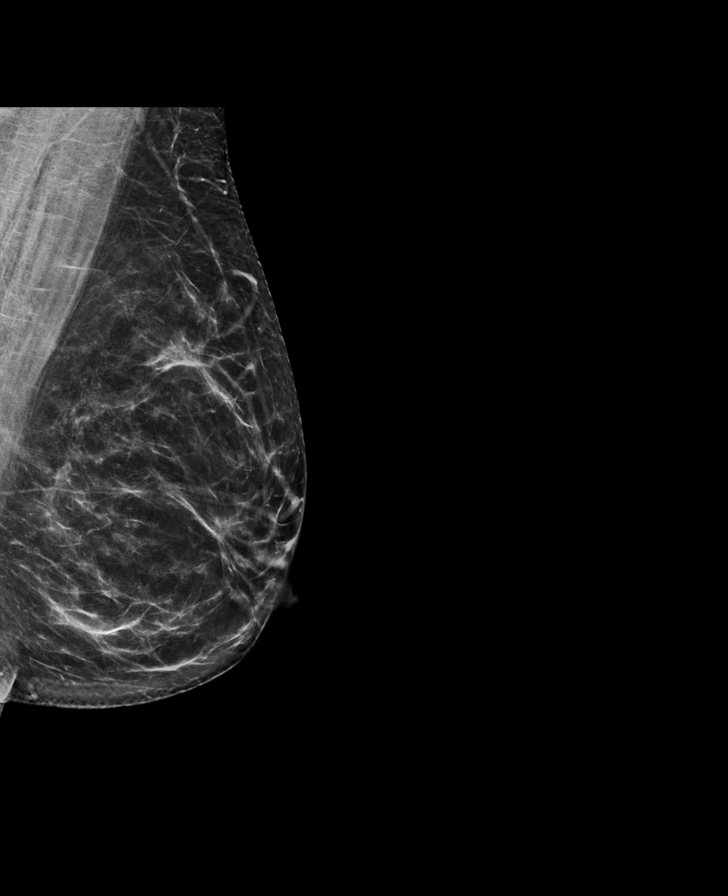

[L CC synth-2D]
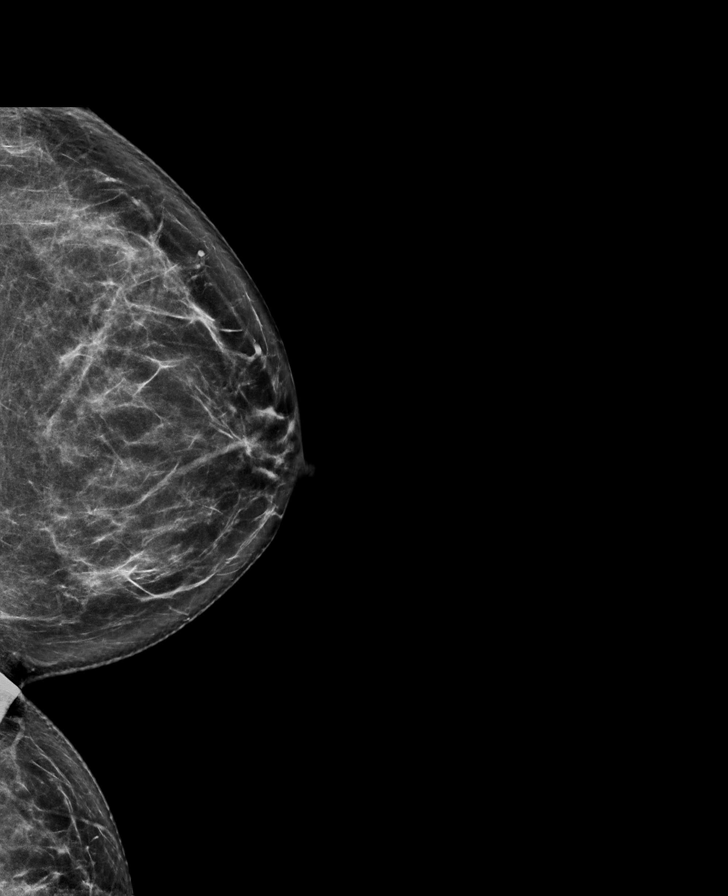

[L CC tomo · tomo slice 37/74.0]
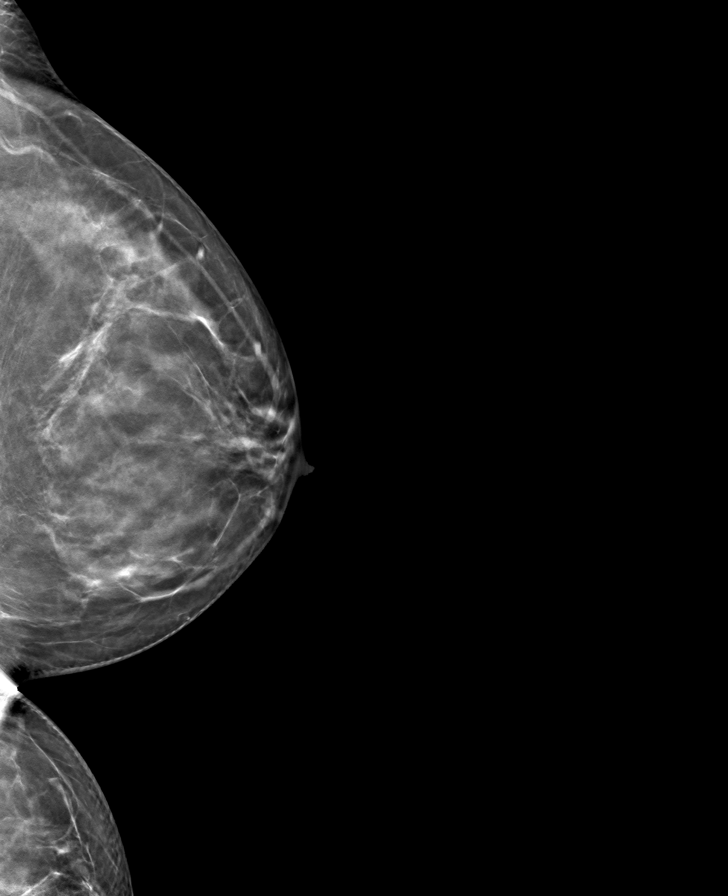

[L MLO tomo · tomo slice 37/73.0]
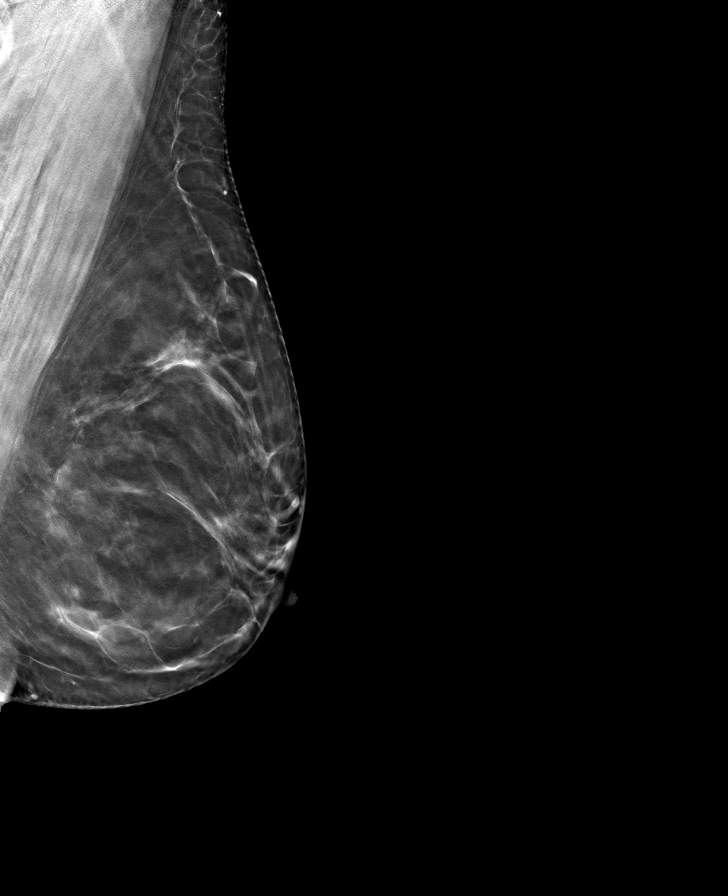

[R MLO tomo · tomo slice 37/73.0]
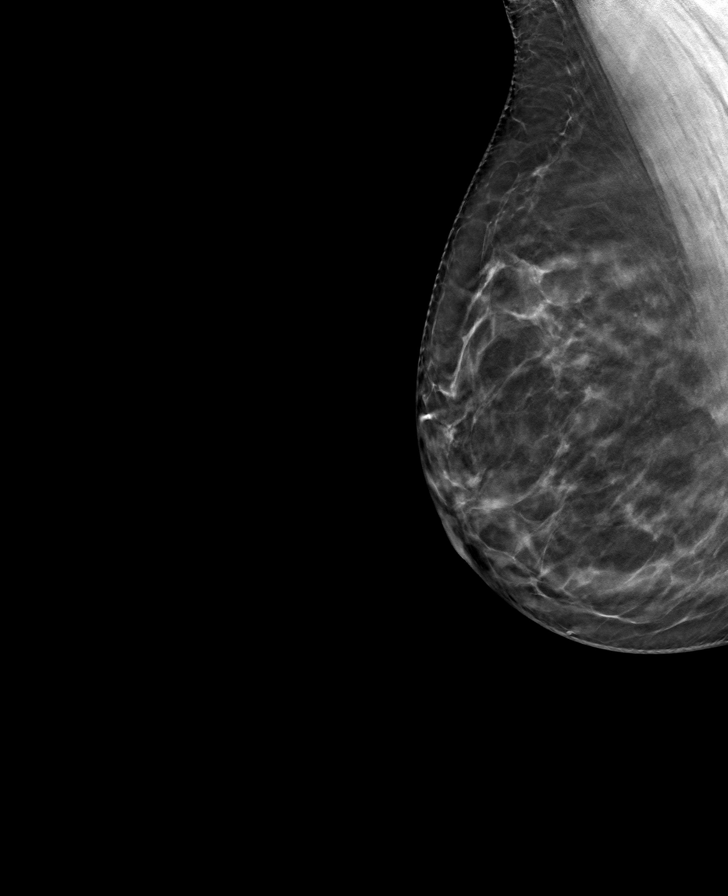

[R CC tomo · tomo slice 41/82.0]
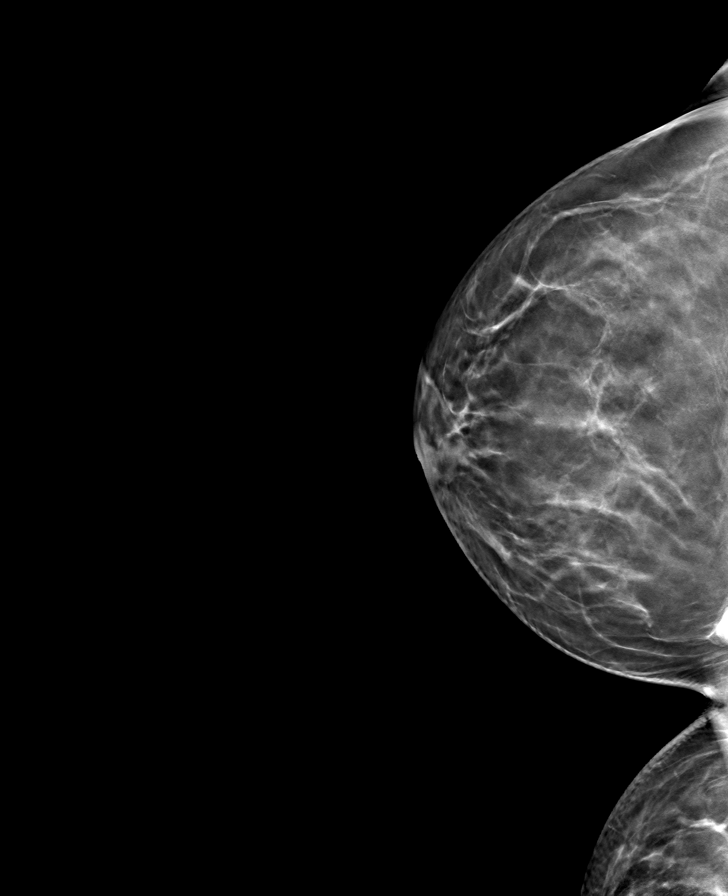

[8 of 24 positions shown; findings below may reference images not displayed]

ACR Breast Density Category b: There are scattered areas of
fibroglandular density.
FINDINGS: There are no findings suspicious for malignancy. Images were
processed with CAD.
IMPRESSION: No mammographic evidence of malignancy. A result letter of this
screening mammogram will be mailed directly to the patient.

RECOMMENDATION:
Screening mammogram in one year. (Code:[id number]-EJ6)

BI-RADS CATEGORY  1: Negative.
